# Patient Record
Sex: Female | Born: 1960
Health system: Southern US, Community
[De-identification: ages and names within clinical notes are randomized; demographics above are authoritative.]

## PROBLEM LIST (undated history)

## (undated) DIAGNOSIS — K219 Gastro-esophageal reflux disease without esophagitis: Secondary | ICD-10-CM

## (undated) DIAGNOSIS — E785 Hyperlipidemia, unspecified: Secondary | ICD-10-CM

## (undated) DIAGNOSIS — D151 Benign neoplasm of heart: Secondary | ICD-10-CM

## (undated) DIAGNOSIS — I272 Pulmonary hypertension, unspecified: Secondary | ICD-10-CM

## (undated) HISTORY — PX: ECTOPIC PREGNANCY SURGERY: SHX613

## (undated) HISTORY — PX: TUBAL LIGATION: SHX77

## (undated) HISTORY — DX: Pulmonary hypertension, unspecified: I27.20

## (undated) HISTORY — PX: APPENDECTOMY: SHX54

## (undated) HISTORY — DX: Hyperlipidemia, unspecified: E78.5

---

## 1998-05-08 ENCOUNTER — Other Ambulatory Visit: Admission: RE | Admit: 1998-05-08 | Discharge: 1998-05-08 | Payer: Self-pay | Admitting: Obstetrics and Gynecology

## 1998-09-25 ENCOUNTER — Inpatient Hospital Stay (HOSPITAL_COMMUNITY): Admission: EM | Admit: 1998-09-25 | Discharge: 1998-09-28 | Payer: Self-pay | Admitting: Emergency Medicine

## 1999-05-13 ENCOUNTER — Encounter: Payer: Self-pay | Admitting: Family Medicine

## 1999-05-13 ENCOUNTER — Encounter: Admission: RE | Admit: 1999-05-13 | Discharge: 1999-05-13 | Payer: Self-pay | Admitting: Family Medicine

## 1999-12-02 ENCOUNTER — Inpatient Hospital Stay (HOSPITAL_COMMUNITY): Admission: EM | Admit: 1999-12-02 | Discharge: 1999-12-04 | Payer: Self-pay

## 1999-12-02 ENCOUNTER — Encounter (INDEPENDENT_AMBULATORY_CARE_PROVIDER_SITE_OTHER): Payer: Self-pay | Admitting: Specialist

## 2000-06-11 ENCOUNTER — Encounter: Payer: Self-pay | Admitting: Emergency Medicine

## 2000-06-11 ENCOUNTER — Emergency Department (HOSPITAL_COMMUNITY): Admission: EM | Admit: 2000-06-11 | Discharge: 2000-06-11 | Payer: Self-pay | Admitting: Emergency Medicine

## 2000-07-08 ENCOUNTER — Ambulatory Visit (HOSPITAL_COMMUNITY): Admission: RE | Admit: 2000-07-08 | Discharge: 2000-07-08 | Payer: Self-pay | Admitting: Family Medicine

## 2002-01-16 ENCOUNTER — Other Ambulatory Visit: Admission: RE | Admit: 2002-01-16 | Discharge: 2002-01-16 | Payer: Self-pay | Admitting: *Deleted

## 2002-11-23 ENCOUNTER — Encounter: Payer: Self-pay | Admitting: Emergency Medicine

## 2002-11-23 ENCOUNTER — Emergency Department (HOSPITAL_COMMUNITY): Admission: EM | Admit: 2002-11-23 | Discharge: 2002-11-23 | Payer: Self-pay | Admitting: Emergency Medicine

## 2002-11-30 ENCOUNTER — Encounter: Payer: Self-pay | Admitting: Family Medicine

## 2002-11-30 ENCOUNTER — Encounter: Admission: RE | Admit: 2002-11-30 | Discharge: 2002-11-30 | Payer: Self-pay | Admitting: Family Medicine

## 2005-04-02 ENCOUNTER — Other Ambulatory Visit: Admission: RE | Admit: 2005-04-02 | Discharge: 2005-04-02 | Payer: Self-pay | Admitting: Obstetrics and Gynecology

## 2006-07-15 ENCOUNTER — Other Ambulatory Visit: Admission: RE | Admit: 2006-07-15 | Discharge: 2006-07-15 | Payer: Self-pay | Admitting: Obstetrics & Gynecology

## 2006-08-03 ENCOUNTER — Encounter: Admission: RE | Admit: 2006-08-03 | Discharge: 2006-08-03 | Payer: Self-pay | Admitting: *Deleted

## 2008-01-26 ENCOUNTER — Ambulatory Visit (HOSPITAL_COMMUNITY): Admission: RE | Admit: 2008-01-26 | Discharge: 2008-01-26 | Payer: Self-pay | Admitting: Family Medicine

## 2008-09-03 ENCOUNTER — Emergency Department (HOSPITAL_COMMUNITY): Admission: EM | Admit: 2008-09-03 | Discharge: 2008-09-03 | Payer: Self-pay | Admitting: Emergency Medicine

## 2010-03-02 ENCOUNTER — Encounter: Admission: RE | Admit: 2010-03-02 | Discharge: 2010-03-02 | Payer: Self-pay | Admitting: Internal Medicine

## 2010-07-12 ENCOUNTER — Encounter: Payer: Self-pay | Admitting: Internal Medicine

## 2010-07-12 ENCOUNTER — Encounter: Payer: Self-pay | Admitting: Obstetrics & Gynecology

## 2010-10-01 LAB — WET PREP, GENITAL

## 2010-10-01 LAB — GC/CHLAMYDIA PROBE AMP, GENITAL: GC Probe Amp, Genital: NEGATIVE

## 2010-11-06 NOTE — Op Note (Signed)
[facility]  Patient:    Ariana Ramirez, Ariana Ramirez                      MRN: [id number] Proc. Date: 12/02/99 Adm. Date:  [id number] Disc. Date: [id number] Attending:  Fortino Sic                           Operative Report  PREOPERATIVE DIAGNOSIS:  Acute appendicitis.  POSTOPERATIVE DIAGNOSIS:  Acute appendicitis.  OPERATION PERFORMED:  Appendectomy.  SURGEON:  Marnee Spring. Wiliam Ke, M.D.  ASSISTANT:  P.A. Palmer.  ANESTHESIA:  Endotracheal by hospital.  DESCRIPTION OF PROCEDURE:  Under good endotracheal anesthesia, the skin of the abdomen was prepped and draped in the usual manner. A transverse incision was made at McBurneys point in deep subcutaneous tissue. The external oblique, internal oblique, and transversalis muscles were divided in the direction of their fibers. The peritoneum was opened, the cecum was identified. The appendix was delivered. The patient clearly had acute appendicitis. The appendiceal mesentery was doubly clamped, cut, and tied off with 2-0 silk. The appendix was tied off with an #0 chromic tie and a 2-0 silk tie. The appendix was excised and the stump was cauterized with electrocautery current. Hemostasis was excellent. The right lower quadrant was irrigated with saline and sucked dry. The peritoneum was closed with running #0 chromic catgut. Several #0 chromic catgut sutures were placed in the internal transversalis layer. The external oblique aponeurosis was closed with running #0 chromic catgut. Each layer was irrigated with saline prior to closure. Several 3-0 Vicryl were placed in Scarpas fascia. The skin was closed with subcuticular 4-0 Dexon and Steri-Strips applied. Estimated blood loss minimal. The patient received no blood in the lab, left the operating room in stable condition after sponge and needle counts were verified. DD:  12/02/99 TD:  12/05/99 Job: [id number]

## 2010-11-06 NOTE — H&P (Signed)
[facility]  Patient:    Ariana Ramirez, Ariana Ramirez                      MRN: [id number] Proc. Date: 12/02/99 Adm. Date:  [id number] Attending:  Fortino Sic                         History and Physical  CHIEF COMPLAINT:  Abdominal pain.  HISTORY OF PRESENT ILLNESS:  This is a 50 year old female, status post left oophorectomy, tubal ligation, ectopic pregnancy, and C-section having normal periods and a negative pregnancy test.  She has a [place] history of right lower quadrant pain.  It was first crampy, now constant.  This is associated with anorexia but nausea or vomiting.  No fevers, chills, or sweating spells. No cough, chest pain, or pleurisy.  No dysuria, frequency, hematuria, or stone.  PAST MEDICAL HISTORY:  Essentially negative.  PAST SURGICAL HISTORY:  As above.  CIGARETTES:  None.  ALCOHOL:  None.  MEDICATIONS:  None.  ALLERGIES:  None.  REVIEW OF SYSTEMS:  Essentially negative.  FAMILY HISTORY:  Noncontributory.  PHYSICAL EXAMINATION:  VITAL SIGNS:  Temperature 99, pulse 88, respirations 16.  GENERAL APPEARANCE:  Well-developed, well-nourished in mild abdominal distress.  HEENT:  Cranium normocephalic.  Eyes, ears, nose, throat normal.  NECK:  Supple.  CHEST:  Clear.  HEART:  Regular rhythm without murmurs, or gallops.  ABDOMEN:  Obese with tenderness in the right lower quadrant and some referred tenderness.  PELVIC AND RECTAL EXAM:  Negative.  EXTREMITIES:  Neurological.  SKIN:  Within normal limits.  ADMITTING IMPRESSION:  Acute appendicitis. DD:  12/02/99 TD:  12/02/99 Job: [id number]

## 2010-11-06 NOTE — Discharge Summary (Signed)
[facility]  Patient:    Ariana Ramirez, Ariana Ramirez                      MRN: [id number] Adm. Date:  [id number] Disc. Date: [id number] Attending:  Fortino Sic                           Discharge Summary  ADMITTING DIAGNOSES:  Acute appendicitis.  DISCHARGE DIAGNOSES:  Acute appendicitis.  OPERATION PERFORMED:  December 02, 1999 appendectomy.  ADMITTING DATA:  This 50 year old female with a 24 hour history of right-sided abdominal pain. Admitting laboratory work reveals a slightly elevated white count with a shift to the left. CT scan performed via the emergency room revealed early acute appendicitis.  HOSPITAL COURSE:  The patient was admitted and prepared for surgery. The surgery was performed with her postoperative course benign. At the time of discharge, she is up and about tolerating a diet.  CONDITION ON DISCHARGE:  Good.  DISCHARGE DIET:  As tolerated.  DISCHARGE ACTIVITY:  No lifting or calisthenics.  DISCHARGE MEDICATIONS:  Vicodin.  FOLLOW-UP:  She is to see Dr. Wiliam Ke 6 or 7 days of discharge. DD:  12/04/99 TD:  12/08/99 Job: [id number]

## 2011-07-21 ENCOUNTER — Encounter: Payer: 59 | Attending: Internal Medicine | Admitting: *Deleted

## 2011-07-21 ENCOUNTER — Encounter: Payer: Self-pay | Admitting: *Deleted

## 2011-07-21 NOTE — Progress Notes (Signed)
  Patient was seen on 07/21/2011 for the first of a series of three diabetes self-management courses at [facility]. The following learning objectives were met by the patient during this course:   Defines the role of glucose and insulin  Identifies type of diabetes and pathophysiology  Defines the diagnostic criteria for diabetes and prediabetes  States the risk factors for Type 2 Diabetes  States the symptoms of Type 2 Diabetes  Defines Type 2 Diabetes treatment goals  Defines Type 2 Diabetes treatment options  States the rationale for glucose monitoring  Identifies A1C, glucose targets, and testing times  Identifies proper sharps disposal  Defines the purpose of a diabetes food plan  Identifies carbohydrate food groups  Defines effects of carbohydrate foods on glucose levels  Identifies carbohydrate choices/grams/food labels  States benefits of physical activity and effect on glucose  Review of suggested activity guidelines  Last A1c: 7.1% (06/23/11 per NH @ [facility])  Handouts given during class include:  Type 2 Diabetes: Basics Book  My Food Plan Book  Food and Activity Log  Patient has established the following initial goals:  Monitor blood glucose levels.  Take medications appropriately.  Lose weight.  Follow-Up Plan: Pt to meet 1-on-1 with Jenita Seashore (RD @ [facility]) for Core Classes II and III d/t scheduling conflicts with classes.

## 2011-07-21 NOTE — Patient Instructions (Signed)
Patient will attend Core Diabetes education visits with Bev Paddock as scheduled or follow up prn.

## 2011-07-29 ENCOUNTER — Encounter: Payer: 59 | Attending: Internal Medicine | Admitting: *Deleted

## 2011-07-29 ENCOUNTER — Encounter: Payer: Self-pay | Admitting: *Deleted

## 2011-07-29 DIAGNOSIS — E119 Type 2 diabetes mellitus without complications: Secondary | ICD-10-CM | POA: Insufficient documentation

## 2011-07-29 DIAGNOSIS — Z713 Dietary counseling and surveillance: Secondary | ICD-10-CM | POA: Insufficient documentation

## 2011-07-29 NOTE — Progress Notes (Signed)
  Patient was seen on 07/29/2011 for the second of a series of three diabetes self-management courses at the [facility]. The following learning objectives were met by the patient during this course:   Explain basic nutrition maintenance and quality assurance  Describe causes, symptoms and treatment of hypoglycemia and hyperglycemia  Explain how to manage diabetes during illness  Describe the importance of good nutrition for health and healthy eating strategies  List strategies to follow meal plan when dining out  Describe problem solving skills for day-to-day glucose challenges  Describe strategies to use when treatment plan needs to change  Identify important factors involved in successful weight loss  Describe ways to remain physically active  Describe the impact of regular activity on insulin resistance  Patient updated their initials goals from Core Class I to include:  Identify Carbohydrate containing foods on plate at each meal  Aim for 30-45 grams carbohydrate at each meal  Handouts given in class:  Refrigerator magnet for Sick Day Guidelines  [facility] Oral medication/insulin handout  Follow-Up Plan: Patient will attend the final class of the ADA Diabetes Self-Care Education.

## 2011-07-30 ENCOUNTER — Ambulatory Visit: Payer: Self-pay | Admitting: *Deleted

## 2011-07-30 ENCOUNTER — Encounter: Payer: 59 | Admitting: *Deleted

## 2011-07-30 NOTE — Progress Notes (Signed)
  Patient was seen on 07/30/2011 individually for the third of a series of three diabetes self-management courses at the [facility]. The following learning objectives were met by the patient during this course:    Describe how diabetes changes over time   Identify diabetes complications and ways to prevent them   Describe strategies that can promote heart health including lowering blood pressure and cholesterol   Describe strategies to lower dietary fat and sodium in the diet   Identify physical activities that benefit cardiovascular health   Evaluate success in meeting personal goal   Describe the belief that they can live successfully with diabetes day to day   Establish 2-3 goals that they will plan to diligently work on until they return for the free 24-month follow-up visit  The following handouts were emailed to patient:  3 Month Follow Up Visit handout  Goal setting handout  Class evaluation form  Your patient has established the following 3 month goals for diabetes self-care:  Identify carbohydrate containing foods at each meal  Aim for 30-45 grams carbohydrate at each meal, 0-15 grams at snacks if hungry  Record BG in log book periodically to evaluate BG management goals  Follow-Up Plan: Patient will attend a 3 month follow-up visit for diabetes self-management education on Oct 25, 2011.

## 2011-10-25 ENCOUNTER — Ambulatory Visit: Payer: 59 | Admitting: *Deleted

## 2011-11-10 ENCOUNTER — Ambulatory Visit: Payer: 59 | Admitting: *Deleted

## 2012-01-10 ENCOUNTER — Encounter: Payer: 59 | Attending: Internal Medicine | Admitting: *Deleted

## 2012-01-10 DIAGNOSIS — Z713 Dietary counseling and surveillance: Secondary | ICD-10-CM | POA: Insufficient documentation

## 2012-01-10 DIAGNOSIS — E119 Type 2 diabetes mellitus without complications: Secondary | ICD-10-CM | POA: Insufficient documentation

## 2012-01-10 NOTE — Progress Notes (Signed)
  Patient was seen on 01/10/2012 for their 3 month follow-up as a part of the diabetes self-management courses at the [facility]. The following learning objectives were met by your patient during this course:  Patient self reports the following:  Diabetes control has improved since diabetes self-management training: YES Number of days blood glucose is >200:  NONE Last MD appointment for diabetes: Scheduled for next week Changes in treatment plan: More aware of carb choices and limits them to 2 servings per meal Confidence with ability to manage diabetes: IMPROVED Areas for improvement with diabetes self-care: Doing well Willingness to participate in diabetes support group: NO  Please see Diabetes Flow sheet for findings related to patient's self-care.  Follow-Up Plan: Patient is eligible for a "free" 30 minute diabetes self-care appointment in the next year. Patient to call and schedule as needed.

## 2013-04-12 ENCOUNTER — Ambulatory Visit (INDEPENDENT_AMBULATORY_CARE_PROVIDER_SITE_OTHER): Payer: Self-pay | Admitting: Family Medicine

## 2013-04-12 DIAGNOSIS — E119 Type 2 diabetes mellitus without complications: Secondary | ICD-10-CM

## 2013-04-12 NOTE — Progress Notes (Signed)
Subjective/Objective:  Patient presents to the [facility] for her 3 month DM follow-up appointment through the employer-sponsored Link to Wellness program. She was originally a patient of Gilman Buttner, RN but has since been transferred to my care after Nancy's departure from her previous position as a CCM at [employer]. 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 Patient reports that she is feeling well otherwise. Patient had been diagnosed with pre-diabetes in 2012 and continues to struggle to accept that diagnosis.  A1c (POC test at today's visit) 7.0%; BP 132/77; P 77; Wt 187 lbs  Assessment:  Patient was diagnosed with pre-diabetes in 2012. She is being managed by Dr. Guerry Bruin at [facility] approximately two times per year. Patient is doing fairly well with managing diabetes with diet and exercise. Current A1c today via POC was 7.0%. She takes metformin once daily for diabetes medication management. Her biggest weakness as far as diet is rice. It is hard for her to resist rice since rice is a staple in her culture and family's diet. She has decreased the amount of rice she eats to just 1-2 servings per day. Otherwise, she is doing well with her diet. Patient reports that she doesn't have much time and/or energy to exercise with working long days. On her days off, she does try to walk some for exercise or do housework. Patient checks blood glucose once weekly in the morning fasting. She reports readings in the 120s. Did not bring glucometer to appointment today. Denies any hypoglycemic episodes, but patient has hypoglycemia awareness. Patient is due for an annual eye exam in December. Sees dentist two times per year. Checks feet daily. Is not currently taking an ACE-inhibitor.  Plan:  Patient's personal goals are as follows: 1.  Lose 5 pounds with diet improvements and exercise 2 days/week. 2. Make yearly eye exam appointment.  I will ask Dr. Wylene Simmer about considering an ACE-inhibitor for this patient.

## 2013-10-08 ENCOUNTER — Ambulatory Visit (INDEPENDENT_AMBULATORY_CARE_PROVIDER_SITE_OTHER): Payer: Self-pay | Admitting: Family Medicine

## 2013-10-08 DIAGNOSIS — E119 Type 2 diabetes mellitus without complications: Secondary | ICD-10-CM

## 2013-10-08 NOTE — Progress Notes (Signed)
Subjective/Objective:  Patient presents to the [place] for her 3 month DM follow-up appointment through the employer-sponsored Link to Wellness program. Patient reports that she is feeling better now that her work schedule has normalized. Previously, she was having to switch between third and first shifts from week to week. Admits that it was overwhelming at work and states that it was hard for her body to adjust to the shift changes. Patient is doing fairly well at managing diabetes. She is currently taking metformin for diabetes medication management.  A1c: 6.8% (2/15); BP 128/77 mmHg; P 78 bpm; Ht 5'7"; Wt 189 lbs; BMI 29.6; Pain 0/10;   Patient tests blood glucose two times weekly. Self-reported CBG (fasting): less than 120   Assessment:  Patient is doing fairly well with managing diabetes with diet and exercise. Patient reports A1c of 6.8% at appointment with Dr. Osborne Casco in February. Previous A1c was 7.0% via POC here at the Outpatient Pharmacy on 04/05/13. She currently takes metformin for diabetes medication management. Her biggest weakness as far as diet is bread, but patient has been restricting the amount of bread she eats. Otherwise, she is doing well with her diet. Patient is exercising a few days a week with a partner at the new gym at [facility]. Patient checks blood glucose twice a week in the morning fasting (does occasionally check in the evenings postprandial). She reports readings in the 120s. Denies any hypoglycemic episodes. Patient is due for an annual eye exam in December. Sees dentist two times per year. Checks feet daily. Is not currently taking an ACE-inhibitor. Patient will see Dr. Osborne Casco in August for follow-up.   Plan:  Patient's personal goals before next follow-up visit with me are as follows:  1. Make more time for exercise (2 days a week).  2. Goal A1c 6.5%. 3. Follow up with Cheryll Dessert, PharmD at the [place] on Thursday, August   27th at 8:30am.

## 2014-02-21 ENCOUNTER — Ambulatory Visit (INDEPENDENT_AMBULATORY_CARE_PROVIDER_SITE_OTHER): Payer: Self-pay | Admitting: Family Medicine

## 2014-02-21 VITALS — BP 128/74 | HR 71 | Wt 194.0 lb

## 2014-02-21 DIAGNOSIS — E119 Type 2 diabetes mellitus without complications: Secondary | ICD-10-CM | POA: Insufficient documentation

## 2014-02-21 NOTE — Progress Notes (Signed)
Patient presents today for 3 month diabetes follow-up as part of the employer-sponsored Link to Wellness program. Patient was formerly seeing Caryl Pina, [facility] and will be transitioning to myself for future LTW visits. Current diabetes regimen includes Metformin. Patient is not on daily ASA or ACEi, but is on daily statin. Most recent MD follow-up was Feb 2015. Patient has a pending appt for next week on 02/27/14. No med changes or major health changes at this time.  Diabetes Assessment: Type of Diabetes: Type 2; Year of diagnosis 2012; Diabetes Education [facility]; MD managing Diabetes [facility]; Sees Diabetes provider 2 times per year; checks feet daily; uses glucometer; hypoglycemia frequency 0; 14 day CBG average CBG readings not available. Did not bring meter to appointment.; Did not bring meter to appointment.; checks blood glucose 1-2 times a week; takes medications as prescribed; does not take an aspirin a day; most recent A1c 6.29 Jul 2013 Other Diabetes History: Current med regimen includes Metformin 500 mg daily. Patient does maintain good medication compliance, and misses doses only on rare occassion. Patient did not bring meter today but is currently testing 1-2 times per week. 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 Per pt report glucose runs 100-130s depending on what she has eaten that particular day. Hypoglycemia frequency is rare. Patient does demonstrate appropriate correction of hypoglycemia. Patient denies signs and symptoms of neuropathy including numbness/tingling/burning and symptoms of foot infection. Patient is not due for yearly eye exam, had most recent exam in Dec 2014. Will be due soon.  Lifestyle Factors: Diet - Patient admits that diet could use improvement. This is mainly due to patients variable work schedule. She works night shift 7a-7p, and works one week on, one week off. Therefore, her daily schedule is  constantly changing. Food recall is as follows: Breakfast - cereal (raisin bran or wheaties), sometimes oatmeal if she has time Lunch - eats at home or cafeteria when at work, usually a rice dish or soup from [employer] - various things, often includes rice Pt feels portion sizes are under good control. Rarely eats sweets. Patient eats white rice often, as this is a cultural dish. She often prepares it with vegetables or meat.  Beverages - one coffee per day at work, hot tea, water, soda only on occassion Exercises - No routine exercise lately. In the past patient has used gym at [facility]. However, her husband was recently diagnosed with bladder cancer and has been undergoing treatment. Their schedule is starting to return to normal and patient is hopeful she will be able to resume exercise soon. She plans to utilize the fitness facility at [facility] and also plans to walk with a friend at [place] shopping center once per week.   Assessment: Patient is doing well today with no change to DM care. She will follow-up with MD soon and have repeat labwork at that time. Patient attempts to maintain healthy diet, but has struggled to find time for routine exercise given recent illness of her husband. She will work to get back on track with regular exercise soon. Patient will return in 3 months for LTW follow-up.  Plan: 1) Continue to make healthy dietary choices 2) Attempt to resume regular exercise once able 3) Continue to be compliant with medications 4) Keep appt with MD on 02/27/14 5) Follow-up with LTW in 3 months on Wednesday Dec 2nd @ 8:30 am

## 2014-02-27 ENCOUNTER — Encounter: Payer: Self-pay | Admitting: Family Medicine

## 2014-02-27 NOTE — Progress Notes (Signed)
Patient ID: Ariana Ramirez, female   DOB: [birth year], 53 y.o.   MRN: [id number] Reviewed: Agree with the documentation and management of our [facility] pharmacologist.

## 2014-05-08 ENCOUNTER — Encounter: Payer: Self-pay | Admitting: Family Medicine

## 2014-05-08 NOTE — Progress Notes (Signed)
Patient ID: Ariana Ramirez, female   DOB: 02/01/1961, 53 y.o.   MRN: [id number] Reviewed: Agree with the documentation and management of our [place] Pharmacologist. 

## 2014-05-08 NOTE — Progress Notes (Signed)
Patient ID: Ariana Ramirez, female   DOB: 08/20/1960, 53 y.o.   MRN: [id number] Reviewed: Agree with the documentation and management of our [place] Pharmacologist. 

## 2014-05-22 ENCOUNTER — Ambulatory Visit (INDEPENDENT_AMBULATORY_CARE_PROVIDER_SITE_OTHER): Payer: Self-pay | Admitting: Family Medicine

## 2014-05-22 VITALS — BP 124/74 | Wt 193.0 lb

## 2014-05-22 DIAGNOSIS — E119 Type 2 diabetes mellitus without complications: Secondary | ICD-10-CM

## 2014-05-22 NOTE — Progress Notes (Signed)
Patient presents today for 3 month diabetes follow-up as part of the employer-sponsored Link to Wellness program. Current diabetes regimen includes Metformin. Patient is not on daily ASA or ACEi, but is on daily statin. Most recent MD follow-up was Sept 2015. Patient has a pending appt for 6 month follow-up in March 2016. No med changes or major health changes at this time.  Diabetes Assessment: Type of Diabetes: Type 2; Year of diagnosis 2012; Diabetes Education [facility]; MD managing Diabetes [place]; Sees Diabetes provider 2 times per year; checks feet daily; uses glucometer; does not take an aspirin a day; hypoglycemia frequency none; Lowest CBG 91; Highest CBG 156; A1c Sept 2015 6.7 Other Diabetes History: Current med regimen includes Metformin 500 mg daily. Patient does maintain good medication compliance, and misses doses only on rare occassion. Patient did not bring meter today but is currently testing once daily, 1-2 times per week. 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 Per pt report glucose runs 120-150s depending on what she has eaten that particular day. Hypoglycemia frequency is rare. Patient does demonstrate appropriate correction of hypoglycemia. Patient denies signs and symptoms of neuropathy including numbness/tingling/burning and symptoms of foot infection. Patient is due for yearly eye exam, and has apt scheduled for late December.   Lifestyle Factors: Diet - No changes to diet recently. Patient feels she needs to improve diet, specifically the time at which she eats. 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 Patient feels food choices and portion sizes are under good control. Food recall as of Sept visit is as follows: Breakfast - cereal (raisin bran or wheaties), sometimes oatmeal if she has time Lunch - eats at home or cafeteria when at work, usually a rice dish or soup from [employer] -  various things, often includes rice Pt feels portion sizes are under good control. Rarely eats sweets. Patient eats white rice often, as this is a cultural dish. She often prepares it with vegetables or meat.  Beverages - one coffee per day at work, hot tea, water, soda only on occassion Exercises - No routine exercise but attempts to stay active around the house. Does not like to walk in the colder weather. Was using [facility] gym on occasion, but has not used it lately. Patient would like to begin exercising more and would like to set a weight loss goal of 5 lbs over the next three months.  Assessment: Patient is doing well today with no change to DM care. She will follow-up with MD in March for 6 month visit. Patient attempts to maintain healthy diet, but has struggled to find time for routine exercise given recent illness of her husband. She will work to get back on track with regular exercise soon. Patient will return in 3 months for LTW follow-up.  Plan: 1) Continue to make healthy dietary choices 2) Attempt to resume regular exercise, weight loss goal of 5 lb 3) Continue to be compliant with medications 4) Keep appt with MD in March 5) Follow-up with LTW in 3 months on Wed March 16th @ 8:30

## 2014-07-04 ENCOUNTER — Encounter: Payer: Self-pay | Admitting: Family Medicine

## 2014-07-04 NOTE — Progress Notes (Signed)
Patient ID: Ariana Ramirez, female   DOB: 08/07/1960, 54 y.o.   MRN: [id number] Reviewed: Agree with the documentation and management of our [place].

## 2014-09-10 ENCOUNTER — Ambulatory Visit: Payer: 59 | Admitting: Pharmacist

## 2014-09-10 ENCOUNTER — Ambulatory Visit (INDEPENDENT_AMBULATORY_CARE_PROVIDER_SITE_OTHER): Payer: Self-pay | Admitting: Family Medicine

## 2014-09-10 VITALS — BP 142/78 | Ht 67.0 in | Wt 199.0 lb

## 2014-09-10 DIAGNOSIS — E119 Type 2 diabetes mellitus without complications: Secondary | ICD-10-CM

## 2014-09-10 NOTE — Progress Notes (Signed)
Subjective:  Patient presents today for 3 month diabetes follow-up as part of the employer-sponsored [facility] program.  Current diabetes regimen includes Metformin.  Patient also continues on daily statin.  She does not tolerate ASA and has not been prescribed ACEi at this time.  Patient is being treated for HTN with Triamterene/HCTZ.  Patient follows-up routinely with Dr. Osborne Casco, PCP for diabetes review and as needed for other medical issues.  No med changes or major health changes at this time.   Assessment/Plan:  Patient is a 54 yo female with DM Type 2. Most recent A1C was 6.7% which is slightly above goal of 6.5% as stated by MD.  Weight has increased by 5 lbs since last visit and is now 199 lb. Patient reports this is stress related.  Patient continues to maintain medication compliance and continues testing once daily, fasting.  Patient is recovering from recent upper respiratory infection (common cold) and reports this has caused slight elevations in glucose.  Patient did not bring meter today but reports fasting is generally 120-130 but has recently been >130.  Also, patient is attempting to enroll/qualify for study sponsored by [employer] [employer] to assess diabetic testing and education.  She is hopeful she will be able to enroll.     Physical Activity- Patient is currently using [place] fitness facility once weekly for 60 min, using treadmill.  She is aware of the need to improve in this area and would like to add an additional day of exercise.  We have discussed the benefits of this and have set an appropriate goal.     Nutrition-Patient attempts to maintain a healthy diet.  After food recall, it appears she is making healthy choices and is attmepting to eat at least three servings of fruit and/or veg per day.  However, she does consume a fair amount of rice with her meals.  She is unsure of her portions of this starchy carb.  We have discussed reading food labels and using a measuring device to  accurately measure rice portions.    Patient will follow up with [facility] in 3 months.    Tilman Neat, PharmD

## 2014-09-10 NOTE — Patient Instructions (Addendum)
1)  Patient will exercise once weekly at home in addition to exercising at [facility] fitness facility 2)  Attempt to improve portion control by measuring starchy carbs using a measuring cup and by reading nutrition labels 3)  Follow-up in 3 months with [facility]

## 2014-09-11 NOTE — Progress Notes (Signed)
ATTENDING PHYSICIAN NOTE: I have reviewed the chart and agree with the plan as detailed above. Shamara Soza MD Pager [phone]  

## 2014-11-26 ENCOUNTER — Encounter: Payer: Self-pay | Admitting: Pharmacist

## 2014-12-07 ENCOUNTER — Emergency Department (HOSPITAL_COMMUNITY)
Admission: EM | Admit: 2014-12-07 | Discharge: 2014-12-07 | Disposition: A | Discharge status: Home / Self Care | Payer: 59 | Source: Home / Self Care | Attending: Emergency Medicine | Admitting: Emergency Medicine

## 2014-12-07 ENCOUNTER — Encounter (HOSPITAL_COMMUNITY): Payer: Self-pay | Admitting: Emergency Medicine

## 2014-12-07 DIAGNOSIS — K2970 Gastritis, unspecified, without bleeding: Secondary | ICD-10-CM | POA: Diagnosis not present

## 2014-12-07 MED ORDER — RANITIDINE HCL 150 MG PO CAPS: 150.0000 mg | ORAL_CAPSULE | Freq: Two times a day (BID) | ORAL | Status: DC

## 2014-12-07 MED ORDER — SUCRALFATE 1 GM/10ML PO SUSP: 1.0000 g | Freq: Three times a day (TID) | ORAL | Status: DC

## 2014-12-07 MED ORDER — GI COCKTAIL ~~LOC~~: 30.0000 mL | Freq: Once | ORAL | Status: DC

## 2014-12-07 MED ORDER — GI COCKTAIL ~~LOC~~
ORAL | Status: AC
  Filled 2014-12-07: qty 30

## 2014-12-07 NOTE — Discharge Instructions (Signed)
The lining of your stomach has become irritated, this is likely from the spicy food in [place]. Continue your protonix daily. Take ranitidine twice a day for the next 2 weeks. Take Carafate before meals and at bedtime for the next 2 weeks. Avoid citrus, tomato, spicy foods for the next 2 weeks. If you start vomiting, you develope severe abdominal pain, you see dark tarry stools, or you see blood in your stool, please go to the emergency room.

## 2014-12-07 NOTE — ED Provider Notes (Signed)
CSN: [id number]     Arrival date & time 12/07/14  1854 History   First MD Initiated Contact with Patient 12/07/14 1913     Chief Complaint  Patient presents with  . Abdominal Pain   (Consider location/radiation/quality/duration/timing/severity/associated sxs/prior Treatment) HPI She is a 54 year old woman here for evaluation of epigastric pain. She states this pain started on Monday. It is located in the epigastrium, she describes it as a constant gnawing pain. She thinks it is getting slightly worse. She reports a decreased appetite and some intermittent nausea, but no vomiting. The pain is not better with eating, and may be slightly worse. She was in [place] and just returned on Tuesday. She did have some diarrhea while in [place], but this cleared up after taking some Flagyl. She had a normal bowel movement this morning. She denies any blood in her stool. She states she did eat some very spicy food in [place].  She is on Protonix for reflux.  Past Medical History  Diagnosis Date  . Diabetes mellitus   . Pulmonary hypertension   . Hyperlipidemia    Past Surgical History  Procedure Laterality Date  . Cesarean section    . Appendectomy    . Ectopic pregnancy surgery    . Tubal ligation     Family History  Problem Relation Age of Onset  . Hypertension Other   . Stroke Father   . Hypertension Paternal Grandmother    History  Substance Use Topics  . Smoking status: Never Smoker   . Smokeless tobacco: Not on file  . Alcohol Use: No   OB History    No data available     Review of Systems As in history of present illness Allergies  Aspirin and Food  Home Medications   Prior to Admission medications   Medication Sig Start Date End Date Taking? Authorizing Provider  metFORMIN (GLUCOPHAGE) 500 MG tablet Take 500 mg by mouth daily.    Yes Historical Provider, MD  pantoprazole (PROTONIX) 40 MG tablet Take 40 mg by mouth 2 (two) times daily.   Yes Historical Provider, MD   simvastatin (ZOCOR) 40 MG tablet Take 40 mg by mouth every evening.   Yes Historical Provider, MD  triamterene-hydrochlorothiazide (MAXZIDE-25) 37.5-25 MG per tablet Take 1 tablet by mouth daily.   Yes Historical Provider, MD  ranitidine (ZANTAC) 150 MG capsule Take 1 capsule (150 mg total) by mouth 2 (two) times daily. For 2 weeks. 12/07/14   Melony Overly, MD  sucralfate (CARAFATE) 1 GM/10ML suspension Take 10 mLs (1 g total) by mouth 4 (four) times daily -  with meals and at bedtime. For 2 weeks. 12/07/14   Melony Overly, MD   BP 148/78 mmHg  Pulse 84  Temp(Src) 98.7 F (37.1 C) (Oral)  Resp 16  SpO2 100% Physical Exam  Constitutional: She is oriented to person, place, and time. She appears well-developed and well-nourished. No distress.  Neck: Neck supple.  Cardiovascular: Normal rate, regular rhythm and normal heart sounds.   No murmur heard. Pulmonary/Chest: Effort normal.  Abdominal: Soft. Bowel sounds are normal. She exhibits no distension. There is tenderness (in epigastric). There is no rebound and no guarding.  Neurological: She is alert and oriented to person, place, and time.    ED Course  Procedures (including critical care time) Labs Review Labs Reviewed - No data to display  Imaging Review No results found.   MDM   1. Gastritis    GI cocktail given.  She  reports improvement after GI cocktail. Will add ranitidine and Carafate to her Protonix for the next 2 weeks. Return precautions reviewed.    Melony Overly, MD 12/07/14 [phone]

## 2014-12-07 NOTE — ED Notes (Signed)
Pt reports epigastric discomfort onset 1 week; denies pain Sx also include decreased appetite, chills, BA Denies fevers Hx of GERD; taking Protonix Reports she just came from [place]  Alert, no signs of acute distress.

## 2014-12-10 ENCOUNTER — Ambulatory Visit: Payer: 59 | Admitting: Pharmacist

## 2014-12-10 ENCOUNTER — Ambulatory Visit: Payer: 59

## 2015-01-09 ENCOUNTER — Ambulatory Visit: Payer: 59 | Admitting: Pharmacist

## 2015-01-09 ENCOUNTER — Ambulatory Visit (INDEPENDENT_AMBULATORY_CARE_PROVIDER_SITE_OTHER): Payer: Self-pay | Admitting: Family Medicine

## 2015-01-09 VITALS — BP 122/66 | Ht 67.0 in | Wt 182.0 lb

## 2015-01-09 DIAGNOSIS — E119 Type 2 diabetes mellitus without complications: Secondary | ICD-10-CM

## 2015-01-09 NOTE — Patient Instructions (Addendum)
1)  Attempt to resume exercise twice weekly once GI issues resolve 2)  Continue making healthy dietary choices, limiting portions of starchy carbs 3)  Continue testing regularly 4)  Schedule a routine dental exam 5)  Follow-up in 3 months on Thursday October 27th at 8:30 am  Great to see you today!

## 2015-01-09 NOTE — Progress Notes (Signed)
  Subjective:  Patient is a 54 yo female with type 2 diabetes who presents today for 3 month follow-up as part of the employer-sponsored [facility] program. Current diabetes regimen includes Metformin. Patient also continues on daily statin.  Intolerant to ASA and not currently taking ACEi.  Most recent MD follow-up was this month with Dr. Osborne Casco. Patient has a pending appt for Sept 2016.  Of note, patient has been under much stress over the past 3-4 weeks.  Her mother passed unexpectedly in late June.  She then had to make an emergency trip to [place] to be with her family.  While in [place] she began developing GI symptoms and was treated for infection.  Once returning to the [place], symptoms continued, she is being treated with carafate tablets and reports this has helped some.  She is being further evaluated by gastro and is scheduled for an endoscopy to look for ulcer or inflammation.  GI symptoms have changed her diet and she is currently adhering to a very bland diet, no dairy, and no heavy, fried, or greasy foods as these increase symptoms.  Diabetes Assessment:  Changes to diabetes regimen include increasing Metformin from once daily to twice daily.  This was done at recent MD appt due to elevated A1c.  A1c likely elevated due to GI illness and stress of losing a family member.  Patient does maintain good medication compliance. Most recent A1c was 7.3% which is exceeding goal of less than 7%.  Weight has decreased by 16 lbs since last visit, due to GI upset.  Patient did bring meter today and is currently testing 1-2 times per day.  She is currently using [employer] meter through the recent study.  She will continue using this meter until supplies run out then she will resume True Result.  Readings are averaging 130s.  Hypoglycemia is rare. Patient does demonstrate appropriate correction of hypoglycemia.  Patient denies signs and symptoms of neuropathy including numbness/tingling/burning and  symptoms of foot infection.  Patient is up to date on eye exam but is due for routine dental exam.      Lifestyle Assessment:  Diet - Diet has changed significantly over the past 3-4 weeks due to GI issues as noted above.  She is adhering to a very bland diet and cannot tolerate dairy, fried or greasy foods.  As a result she has lost 16 lbs and is being evaluated by gastro for possible causes.    Exercise - Prior to patients travel to [place] she was exercising twice weekly at [facility] fitness facility for 60 minutes each time.  Since losing her mother, traveling to [place] to be with family, and the onset of various GI symptoms, patient has not felt like exercising.  She hopes to resume exercise once GI testing is complete and symptoms have resolved.    Plan and Goals: 1)  Attempt to resume exercise twice weekly once GI issues resolve 2)  Continue making healthy dietary choices, limiting portions of starchy carbs 3)  Continue testing regularly 4)  Schedule a routine dental exam 5)  Follow-up in 3 months on Thursday October 27th at 8:30 am   Tilman Neat, PharmD [facility] [place]  [phone]

## 2015-01-14 NOTE — Progress Notes (Signed)
ATTENDING PHYSICIAN NOTE: I have reviewed the chart and agree with the plan as detailed above. Joliyah Lippens MD Pager [phone]  

## 2015-04-17 ENCOUNTER — Ambulatory Visit: Payer: 59 | Admitting: Pharmacist

## 2015-04-23 ENCOUNTER — Encounter: Payer: Self-pay | Admitting: Pharmacist

## 2015-04-23 ENCOUNTER — Ambulatory Visit (INDEPENDENT_AMBULATORY_CARE_PROVIDER_SITE_OTHER): Payer: Self-pay | Admitting: Family Medicine

## 2015-04-23 VITALS — BP 116/68 | Wt 176.0 lb

## 2015-04-23 DIAGNOSIS — E119 Type 2 diabetes mellitus without complications: Secondary | ICD-10-CM

## 2015-04-23 NOTE — Patient Instructions (Signed)
1)  Great job with resuming exercise, attempt to maintain this throughout the winter 2)  Continue to maintain making healthy dietary choices 3)  Continue testing regularly 4)  Great job with reduced A1c and weight! 5)  Follow-up in 3 months on Thursday Feb 2nd @ 8:30 am  Great to see you today!

## 2015-04-23 NOTE — Progress Notes (Signed)
  Subjective:  Patient is a 54 yo female with type 2 diabetes who presents today for 3 month follow-up as part of the employer-sponsored [facility] program. Current diabetes regimen includes Metformin. Patient also continues on daily statin.  Intolerant to ASA and not currently taking ACEi.  Most recent MD follow-up was with Dr. Osborne Casco, October 2016. Patient has a pending appt for Jan 2017.  Of note, patient's previous episode with GI illness has since resolved.  She is no longer having GI issues.  Labwork prior for October appt: A1c 6.9% BMET, CBC, Lipid panel, and urinalysis normal   Diabetes Assessment:  No changes to diabetes regimen at this time.  Patient does maintain good medication compliance. Most recent A1c was improved at 6.9% (prev 7.3%) which is now at goal of less than 7%.  Weight has decreased by an additional 6 lbs since last visit, due to dietary changes.  Patient did not bring meter today but is currently testing 1-2 times per day.  She is now using TrueResult meter, but will switch to TrueMetrix when current supply runs out.  She has a surplus of supplies as she was previously using [employer].  Per pateint report readings are averaging 120s, highest 137, no readings less than 100.  Hypoglycemia is rare and occurs only at work when she is late having a lunch break. Patient does demonstrate appropriate correction of hypoglycemia.  Patient denies signs and symptoms of neuropathy including numbness/tingling/burning and symptoms of foot infection.  Patient is up to date on eye and dental exams.     Lifestyle Assessment:  Diet - Diet is now back to normal after recent GI episode resolved.  However, she continues losing weight as she has committed to making healthier choices.  She is now avoiding eating after 7:30 pm.  When eating out she is now ordering water instead of soda and is better controlling portion sizes.  She is also eating less bread, only once weekly.  Eating  more oatmeal now for breakfast vs toast.  Goal to maintain changes.    Exercise - Has now resumed exercise.  Using [facility] fitness facility twice weekly, using treadmill, for 30-40 minutes each time.  Walking more at work now, overall getting more steps during the day.    Plan and Goals: 1)  Great job with resuming exercise, attempt to maintain this throughout the winter 2)  Continue to maintain making healthy dietary choices 3)  Continue testing regularly 4)  Great job with reduced A1c and weight! 5)  Follow-up in 3 months on Thursday Feb 2nd @ 8:30 am  Great to see you today!  Tilman Neat, PharmD [facility]  [phone]

## 2015-05-06 NOTE — Progress Notes (Signed)
I have reviewed this pharmacist's note and agree  

## 2015-07-02 DIAGNOSIS — H52223 Regular astigmatism, bilateral: Secondary | ICD-10-CM | POA: Diagnosis not present

## 2015-07-24 ENCOUNTER — Ambulatory Visit: Payer: 59 | Admitting: Pharmacist

## 2015-07-31 ENCOUNTER — Ambulatory Visit (INDEPENDENT_AMBULATORY_CARE_PROVIDER_SITE_OTHER): Payer: Self-pay | Admitting: Family Medicine

## 2015-07-31 ENCOUNTER — Encounter: Payer: Self-pay | Admitting: Pharmacist

## 2015-07-31 VITALS — BP 120/66 | Wt 178.0 lb

## 2015-07-31 DIAGNOSIS — E119 Type 2 diabetes mellitus without complications: Secondary | ICD-10-CM

## 2015-07-31 MED FILL — SIMVASTATIN 40 MG TABLET: 40 | 90 days supply | Qty: 90 | Fill #3

## 2015-07-31 MED FILL — metFORMIN HCL 500 MG TABS: 500 | 90 days supply | Qty: 180 | Fill #0

## 2015-07-31 MED FILL — TRIAMTERENE-HCTZ 37.5-25 MG: 37.5-25 | 90 days supply | Qty: 90 | Fill #3

## 2015-07-31 MED FILL — PANTOPRAZOLE SOD DR 40 MG T: 40 | 90 days supply | Qty: 180 | Fill #3

## 2015-07-31 NOTE — Progress Notes (Signed)
  Subjective:  Patient is a 55 yo female with type 2 diabetes who presents today for 3 month follow-up as part of the employer-sponsored [facility] program. Current diabetes regimen includes Metformin. Patient also continues on daily statin.  Intolerant to ASA and not currently taking ACEi.  Most recent MD follow-up was with Dr. Osborne Casco, October 2016. Patient had a pending appt for Jan 2017, but this was rescheduled by office due to MD being out of office.  Patient will now follow-up in March 2017.    Diabetes Assessment:  No changes to diabetes regimen at this time.  Patient does maintain good medication compliance. Most recent A1c was improved at 6.9% (prev 7.3%) which is now at goal of less than 7%.  Weight has increased slightly (2 lb) since last visit.  Patient has rejoined [place] diabetes program through [facility] and is once again using their glucometer/supplies.  She did not bring the meter, but can view all readings via smartphone app.  She is testing 1-2 times daily, fasting and/or after supper.  Readings are all 89-123, except for one outlier of 176 due to checking less than two hours after supper.  Program will last at least three months.      Hypoglycemia is rare and occurs only at work when she is late having a lunch break. Patient does demonstrate appropriate correction of hypoglycemia.  Patient denies signs and symptoms of neuropathy including numbness/tingling/burning and symptoms of foot infection.  Patient is up to date on eye and dental exams.  Eye exam in Jan 2017 was negative for retinopathy.     Lifestyle Assessment:  Diet - Patient has attempted to maintain a healthy diet and is limiting soda and carbs, especially breads. Food recall: Breakfast - egg and oatmeal or cereal (2% milk), coffee (creamer) Snack - only on occasion, mango or guava fruit Lunch - packs lunch mostly or gets soup from cafeteria, noodles with vegetables   Snack - none Supper - eating at home,  rice and stew, 6 inch sub subway carved [place], starch is either rice, noodles, or sometimes potatoes.  Veg spinach or brocolli.  No bread. Beverages - water, hot tea (sugar free)  Would like to be more consistent with timing of meals and snacks.  Work schedule sometimes makes it difficult to eat at normal interavals.    Exercise - Walking at work during lunch break and during work hours, also wearing a pedometer.  Usually on work days gets 6000-9000 steps per day.  Also going to fitness facility twice weekly for 60 minutes, treadmill and elliptical.  Will set a goal of achieving 10,000 steps per day.     Plan and Goals: 1)  Continue to exercise in fitness facility at least twice per week and attempt to reach goal of 10,000 steps per day on pedometer 2)  Attempt to improve consistency of meal times 3)  Continue testing regularly as directed by [facility] program 4)  Follow-up in 4 months on Thursday June 15th @ 10:00 am  Great to see you today!  Tilman Neat, PharmD [facility]  [phone]

## 2015-07-31 NOTE — Patient Instructions (Signed)
1)  Continue to exercise in fitness facility at least twice per week and attempt to reach goal of 10,000 steps per day on pedometer 2)  Attempt to improve consistency of meal times 3)  Continue testing regularly as directed by [employer] program 4)  Follow-up in 4 months on Thursday June 15th @ 10:00 am  Great to see you today!

## 2015-08-14 NOTE — Progress Notes (Signed)
I have reviewed this pharmacist's note and agree  

## 2015-11-26 MED FILL — TRIAMTERENE/HCTZ 37.5/25 TB: 37.5-25 | 90 days supply | Qty: 90 | Fill #0

## 2015-12-04 ENCOUNTER — Ambulatory Visit: Payer: Self-pay | Admitting: Pharmacist

## 2016-01-21 DIAGNOSIS — E669 Obesity, unspecified: Secondary | ICD-10-CM | POA: Diagnosis not present

## 2016-01-21 DIAGNOSIS — Z6828 Body mass index (BMI) 28.0-28.9, adult: Secondary | ICD-10-CM | POA: Diagnosis not present

## 2016-01-21 DIAGNOSIS — R05 Cough: Secondary | ICD-10-CM | POA: Diagnosis not present

## 2016-01-21 DIAGNOSIS — I10 Essential (primary) hypertension: Secondary | ICD-10-CM | POA: Diagnosis not present

## 2016-01-21 DIAGNOSIS — K219 Gastro-esophageal reflux disease without esophagitis: Secondary | ICD-10-CM | POA: Diagnosis not present

## 2016-02-13 DIAGNOSIS — E7800 Pure hypercholesterolemia, unspecified: Secondary | ICD-10-CM | POA: Diagnosis not present

## 2016-02-13 DIAGNOSIS — D509 Iron deficiency anemia, unspecified: Secondary | ICD-10-CM | POA: Diagnosis not present

## 2016-02-13 DIAGNOSIS — I10 Essential (primary) hypertension: Secondary | ICD-10-CM | POA: Diagnosis not present

## 2016-02-13 DIAGNOSIS — E668 Other obesity: Secondary | ICD-10-CM | POA: Diagnosis not present

## 2016-02-13 DIAGNOSIS — E119 Type 2 diabetes mellitus without complications: Secondary | ICD-10-CM | POA: Diagnosis not present

## 2016-02-13 DIAGNOSIS — K219 Gastro-esophageal reflux disease without esophagitis: Secondary | ICD-10-CM | POA: Diagnosis not present

## 2016-02-13 DIAGNOSIS — Z6828 Body mass index (BMI) 28.0-28.9, adult: Secondary | ICD-10-CM | POA: Diagnosis not present

## 2016-02-13 DIAGNOSIS — I272 Other secondary pulmonary hypertension: Secondary | ICD-10-CM | POA: Diagnosis not present

## 2016-02-13 MED FILL — metFORMIN HCL 500 MG TABS: 500 | 90 days supply | Qty: 180 | Fill #0

## 2016-02-13 MED FILL — SIMVASTATIN 40 MG TABLET: 40 | 90 days supply | Qty: 90 | Fill #0

## 2016-02-13 MED FILL — TRIAMTERENE/HCTZ 37.5/25 TB: 37.5-25 | 90 days supply | Qty: 90 | Fill #0

## 2016-02-18 MED FILL — PANTOPRAZOLE SOD DR 40 MG T: 40 | 90 days supply | Qty: 180 | Fill #0

## 2016-03-29 DIAGNOSIS — E119 Type 2 diabetes mellitus without complications: Secondary | ICD-10-CM | POA: Diagnosis not present

## 2016-03-29 DIAGNOSIS — Z0000 Encounter for general adult medical examination without abnormal findings: Secondary | ICD-10-CM | POA: Diagnosis not present

## 2016-04-12 DIAGNOSIS — I2789 Other specified pulmonary heart diseases: Secondary | ICD-10-CM | POA: Diagnosis not present

## 2016-04-12 DIAGNOSIS — E668 Other obesity: Secondary | ICD-10-CM | POA: Diagnosis not present

## 2016-04-12 DIAGNOSIS — K219 Gastro-esophageal reflux disease without esophagitis: Secondary | ICD-10-CM | POA: Diagnosis not present

## 2016-04-12 DIAGNOSIS — N182 Chronic kidney disease, stage 2 (mild): Secondary | ICD-10-CM | POA: Diagnosis not present

## 2016-04-12 DIAGNOSIS — Z1389 Encounter for screening for other disorder: Secondary | ICD-10-CM | POA: Diagnosis not present

## 2016-04-12 DIAGNOSIS — E7800 Pure hypercholesterolemia, unspecified: Secondary | ICD-10-CM | POA: Diagnosis not present

## 2016-04-12 DIAGNOSIS — D509 Iron deficiency anemia, unspecified: Secondary | ICD-10-CM | POA: Diagnosis not present

## 2016-04-12 DIAGNOSIS — I10 Essential (primary) hypertension: Secondary | ICD-10-CM | POA: Diagnosis not present

## 2016-04-12 DIAGNOSIS — E119 Type 2 diabetes mellitus without complications: Secondary | ICD-10-CM | POA: Diagnosis not present

## 2016-04-19 ENCOUNTER — Other Ambulatory Visit: Payer: Self-pay | Admitting: Internal Medicine

## 2016-04-19 DIAGNOSIS — Z1231 Encounter for screening mammogram for malignant neoplasm of breast: Secondary | ICD-10-CM

## 2016-05-06 ENCOUNTER — Ambulatory Visit
Admission: RE | Admit: 2016-05-06 | Discharge: 2016-05-06 | Disposition: A | Discharge status: Home / Self Care | Payer: 59 | Source: Ambulatory Visit | Attending: Internal Medicine | Admitting: Internal Medicine

## 2016-05-06 DIAGNOSIS — Z1231 Encounter for screening mammogram for malignant neoplasm of breast: Secondary | ICD-10-CM | POA: Diagnosis not present

## 2016-05-12 ENCOUNTER — Other Ambulatory Visit: Payer: Self-pay | Admitting: Internal Medicine

## 2016-05-12 DIAGNOSIS — R928 Other abnormal and inconclusive findings on diagnostic imaging of breast: Secondary | ICD-10-CM

## 2016-05-20 ENCOUNTER — Ambulatory Visit
Admission: RE | Admit: 2016-05-20 | Discharge: 2016-05-20 | Disposition: A | Discharge status: Home / Self Care | Payer: 59 | Source: Ambulatory Visit | Attending: Internal Medicine | Admitting: Internal Medicine

## 2016-05-20 ENCOUNTER — Other Ambulatory Visit: Payer: 59

## 2016-05-20 DIAGNOSIS — R928 Other abnormal and inconclusive findings on diagnostic imaging of breast: Secondary | ICD-10-CM

## 2016-05-20 DIAGNOSIS — R920 Mammographic microcalcification found on diagnostic imaging of breast: Secondary | ICD-10-CM | POA: Diagnosis not present

## 2016-05-31 MED FILL — TRIAMTERENE-HCTZ 37.5-25 MG: 37.5-25 | 90 days supply | Qty: 90 | Fill #1

## 2016-05-31 MED FILL — SIMVASTATIN 40 MG TABLET: 40 | 90 days supply | Qty: 90 | Fill #1

## 2016-05-31 MED FILL — PANTOPRAZOLE SOD DR 40 MG T: 40 | 90 days supply | Qty: 180 | Fill #1

## 2016-05-31 MED FILL — metFORMIN HCL 500 MG TABS: 500 | 90 days supply | Qty: 180 | Fill #1

## 2016-08-06 MED FILL — AZITHROMYCIN 250 MG TABLET: 250 | 5 days supply | Qty: 6 | Fill #0

## 2016-08-31 MED FILL — metFORMIN HCL 500 MG TABS: 500 | 90 days supply | Qty: 180 | Fill #2

## 2016-08-31 MED FILL — TRIAMTERENE-HCTZ 37.5-25 MG: 37.5-25 | 90 days supply | Qty: 90 | Fill #2

## 2016-08-31 MED FILL — PANTOPRAZOLE SOD DR 40 MG T: 40 | 90 days supply | Qty: 180 | Fill #2

## 2016-09-11 DIAGNOSIS — H52223 Regular astigmatism, bilateral: Secondary | ICD-10-CM | POA: Diagnosis not present

## 2016-10-22 DIAGNOSIS — I10 Essential (primary) hypertension: Secondary | ICD-10-CM | POA: Diagnosis not present

## 2016-10-22 DIAGNOSIS — K219 Gastro-esophageal reflux disease without esophagitis: Secondary | ICD-10-CM | POA: Diagnosis not present

## 2016-10-22 DIAGNOSIS — I2789 Other specified pulmonary heart diseases: Secondary | ICD-10-CM | POA: Diagnosis not present

## 2016-10-22 DIAGNOSIS — E668 Other obesity: Secondary | ICD-10-CM | POA: Diagnosis not present

## 2016-10-22 DIAGNOSIS — R808 Other proteinuria: Secondary | ICD-10-CM | POA: Diagnosis not present

## 2016-10-22 DIAGNOSIS — E7800 Pure hypercholesterolemia, unspecified: Secondary | ICD-10-CM | POA: Diagnosis not present

## 2016-10-22 DIAGNOSIS — D508 Other iron deficiency anemias: Secondary | ICD-10-CM | POA: Diagnosis not present

## 2016-10-22 DIAGNOSIS — E1129 Type 2 diabetes mellitus with other diabetic kidney complication: Secondary | ICD-10-CM | POA: Diagnosis not present

## 2016-10-22 DIAGNOSIS — N182 Chronic kidney disease, stage 2 (mild): Secondary | ICD-10-CM | POA: Diagnosis not present

## 2016-11-17 ENCOUNTER — Telehealth: Payer: Self-pay

## 2016-11-17 NOTE — Patient Outreach (Signed)
Called [facility] member to schedule a follow up appointment, there was no answer, left message to call me back. 

## 2016-12-03 MED FILL — TRIAMTERENE-HCTZ 37.5-25 MG: 37.5-25 | 90 days supply | Qty: 90 | Fill #3

## 2016-12-03 MED FILL — PANTOPRAZOLE SOD DR 40 MG T: 40 | 90 days supply | Qty: 180 | Fill #3

## 2016-12-03 MED FILL — metFORMIN HCL 500 MG TABS: 500 | 90 days supply | Qty: 180 | Fill #3

## 2017-01-06 ENCOUNTER — Telehealth: Payer: Self-pay

## 2017-01-06 NOTE — Patient Outreach (Signed)
Called [facility] member to schedule a follow up appointment, there was no answer, left message to call me back. 

## 2017-03-14 MED FILL — TRIAMTERENE/HCTZ 37.5/25 TB: 37.5-25 | 90 days supply | Qty: 90 | Fill #0

## 2017-03-14 MED FILL — metFORMIN HCL 500 MG TABS: 500 | 90 days supply | Qty: 180 | Fill #0

## 2017-03-14 MED FILL — PANTOPRAZOLE SOD DR 40 MG T: 40 | 90 days supply | Qty: 180 | Fill #0

## 2017-04-01 ENCOUNTER — Telehealth: Payer: Self-pay

## 2017-04-01 NOTE — Patient Outreach (Signed)
Called [facility] member to schedule a follow up appointment, there was no answer, left message to call me back. 

## 2017-04-06 DIAGNOSIS — E1129 Type 2 diabetes mellitus with other diabetic kidney complication: Secondary | ICD-10-CM | POA: Diagnosis not present

## 2017-04-06 DIAGNOSIS — Z0000 Encounter for general adult medical examination without abnormal findings: Secondary | ICD-10-CM | POA: Diagnosis not present

## 2017-04-06 DIAGNOSIS — Z7689 Persons encountering health services in other specified circumstances: Secondary | ICD-10-CM | POA: Diagnosis not present

## 2017-04-13 DIAGNOSIS — N912 Amenorrhea, unspecified: Secondary | ICD-10-CM | POA: Diagnosis not present

## 2017-04-13 DIAGNOSIS — Z0000 Encounter for general adult medical examination without abnormal findings: Secondary | ICD-10-CM | POA: Diagnosis not present

## 2017-04-13 DIAGNOSIS — N182 Chronic kidney disease, stage 2 (mild): Secondary | ICD-10-CM | POA: Diagnosis not present

## 2017-04-13 DIAGNOSIS — E668 Other obesity: Secondary | ICD-10-CM | POA: Diagnosis not present

## 2017-04-13 DIAGNOSIS — D508 Other iron deficiency anemias: Secondary | ICD-10-CM | POA: Diagnosis not present

## 2017-04-13 DIAGNOSIS — I10 Essential (primary) hypertension: Secondary | ICD-10-CM | POA: Diagnosis not present

## 2017-04-13 DIAGNOSIS — E7800 Pure hypercholesterolemia, unspecified: Secondary | ICD-10-CM | POA: Diagnosis not present

## 2017-04-13 DIAGNOSIS — E1129 Type 2 diabetes mellitus with other diabetic kidney complication: Secondary | ICD-10-CM | POA: Diagnosis not present

## 2017-04-13 DIAGNOSIS — Z1389 Encounter for screening for other disorder: Secondary | ICD-10-CM | POA: Diagnosis not present

## 2017-04-13 DIAGNOSIS — R808 Other proteinuria: Secondary | ICD-10-CM | POA: Diagnosis not present

## 2017-04-13 MED FILL — FREESTYLE LANCETS: 50 days supply | Qty: 100 | Fill #0

## 2017-04-13 MED FILL — ATORVASTATIN 80 MG TABLET: 80 | 90 days supply | Qty: 90 | Fill #0

## 2017-04-13 MED FILL — FREESTYLE LITE TEST STRIP: 50 days supply | Qty: 100 | Fill #0

## 2017-06-02 MED FILL — JANUMET XR 100-1,000 MG TAB: 100-1000 | 30 days supply | Qty: 30 | Fill #0

## 2017-06-03 MED FILL — TRIAMTERENE/HCTZ 37.5/25 TB: 37.5-25 | 90 days supply | Qty: 90 | Fill #1

## 2017-06-03 MED FILL — PANTOPRAZOLE SOD DR 40 MG T: 40 | 90 days supply | Qty: 180 | Fill #1

## 2017-07-01 MED FILL — JANUMET XR 100-1,000 MG TAB: 100-1000 | 30 days supply | Qty: 30 | Fill #1

## 2017-07-01 MED FILL — ATORVASTATIN 80 MG TABLET: 80 | 90 days supply | Qty: 90 | Fill #1

## 2017-08-01 MED FILL — JANUMET XR 100-1,000 MG TAB: 100-1000 | 30 days supply | Qty: 30 | Fill #2

## 2017-09-05 MED FILL — PANTOPRAZOLE SOD DR 40 MG T: 40 | 90 days supply | Qty: 180 | Fill #2

## 2017-09-05 MED FILL — TRIAMTERENE-HCTZ 37.5-25 MG: 37.5-25 | 90 days supply | Qty: 90 | Fill #2

## 2017-09-05 MED FILL — JANUMET XR 100-1,000 MG TAB: 100-1000 | 30 days supply | Qty: 30 | Fill #3

## 2017-09-12 DIAGNOSIS — E669 Obesity, unspecified: Secondary | ICD-10-CM | POA: Diagnosis not present

## 2017-09-12 DIAGNOSIS — K219 Gastro-esophageal reflux disease without esophagitis: Secondary | ICD-10-CM | POA: Diagnosis not present

## 2017-09-12 DIAGNOSIS — D509 Iron deficiency anemia, unspecified: Secondary | ICD-10-CM | POA: Diagnosis not present

## 2017-09-12 DIAGNOSIS — E1129 Type 2 diabetes mellitus with other diabetic kidney complication: Secondary | ICD-10-CM | POA: Diagnosis not present

## 2017-09-12 DIAGNOSIS — E7800 Pure hypercholesterolemia, unspecified: Secondary | ICD-10-CM | POA: Diagnosis not present

## 2017-09-12 DIAGNOSIS — I10 Essential (primary) hypertension: Secondary | ICD-10-CM | POA: Diagnosis not present

## 2017-09-12 DIAGNOSIS — I272 Pulmonary hypertension, unspecified: Secondary | ICD-10-CM | POA: Diagnosis not present

## 2017-09-12 DIAGNOSIS — R809 Proteinuria, unspecified: Secondary | ICD-10-CM | POA: Diagnosis not present

## 2017-09-12 DIAGNOSIS — N182 Chronic kidney disease, stage 2 (mild): Secondary | ICD-10-CM | POA: Diagnosis not present

## 2017-10-07 MED FILL — ATORVASTATIN 80 MG TABLET: 80 | 90 days supply | Qty: 90 | Fill #2

## 2017-10-07 MED FILL — JANUMET XR 100-1,000 MG TAB: 100-1000 | 30 days supply | Qty: 30 | Fill #4

## 2017-11-10 MED FILL — JANUMET XR 100-1,000 MG TAB: 100-1000 | 30 days supply | Qty: 30 | Fill #5

## 2017-12-14 MED FILL — PANTOPRAZOLE SOD DR 40 MG T: 40 | 90 days supply | Qty: 180 | Fill #0

## 2017-12-14 MED FILL — JANUMET XR 100-1,000 MG TAB: 100-1000 | 30 days supply | Qty: 30 | Fill #6

## 2017-12-14 MED FILL — TRIAMTERENE/HCTZ 37.5/25 TB: 37.5-25 | 90 days supply | Qty: 90 | Fill #0

## 2018-01-16 MED FILL — JANUMET XR 100-1,000 MG TAB: 100-1000 | 30 days supply | Qty: 30 | Fill #7

## 2018-01-16 MED FILL — ATORVASTATIN 80 MG TABLET: 80 | 90 days supply | Qty: 90 | Fill #3

## 2018-01-17 DIAGNOSIS — Z6830 Body mass index (BMI) 30.0-30.9, adult: Secondary | ICD-10-CM | POA: Diagnosis not present

## 2018-01-17 DIAGNOSIS — R05 Cough: Secondary | ICD-10-CM | POA: Diagnosis not present

## 2018-01-17 DIAGNOSIS — J309 Allergic rhinitis, unspecified: Secondary | ICD-10-CM | POA: Diagnosis not present

## 2018-01-17 DIAGNOSIS — J40 Bronchitis, not specified as acute or chronic: Secondary | ICD-10-CM | POA: Diagnosis not present

## 2018-01-17 MED FILL — predniSONE 5 MG TABS: 5 | 6 days supply | Qty: 21 | Fill #0

## 2018-01-17 MED FILL — FLUTICASONE PROP 50 MCG SPR: 50 | 30 days supply | Qty: 16 | Fill #0

## 2018-02-10 ENCOUNTER — Ambulatory Visit (INDEPENDENT_AMBULATORY_CARE_PROVIDER_SITE_OTHER)
Admission: RE | Admit: 2018-02-10 | Discharge: 2018-02-10 | Disposition: A | Discharge status: Home / Self Care | Payer: 59 | Source: Ambulatory Visit | Attending: Emergency Medicine | Admitting: Emergency Medicine

## 2018-02-10 ENCOUNTER — Ambulatory Visit: Payer: 59 | Admitting: Emergency Medicine

## 2018-02-10 ENCOUNTER — Encounter: Payer: Self-pay | Admitting: Emergency Medicine

## 2018-02-10 VITALS — BP 124/70 | HR 85 | Ht 66.0 in | Wt 194.0 lb

## 2018-02-10 DIAGNOSIS — I272 Pulmonary hypertension, unspecified: Secondary | ICD-10-CM | POA: Diagnosis not present

## 2018-02-10 DIAGNOSIS — R053 Chronic cough: Secondary | ICD-10-CM

## 2018-02-10 DIAGNOSIS — R05 Cough: Secondary | ICD-10-CM | POA: Diagnosis not present

## 2018-02-10 DIAGNOSIS — K219 Gastro-esophageal reflux disease without esophagitis: Secondary | ICD-10-CM | POA: Diagnosis not present

## 2018-02-10 NOTE — Assessment & Plan Note (Signed)
On protonix bid, was able to stop zantac

## 2018-02-10 NOTE — Patient Instructions (Signed)
CXR today We will check an echocardiogram  Continue your protonix twice a day as you have been taking You may need to restart allergy medication this Fall or Spring if you have recurrent congestion or cough.  Follow with Dr Lamonte Sakai next available after the echo to review together.

## 2018-02-10 NOTE — Assessment & Plan Note (Signed)
Questionable diagnosis, made in the 90s when she saw Dr. Arcelia Jew with [facility].  Apparently had an echocardiogram at that time but I do not have that result available.  She was started on Maxide at the time.  She did not have a right heart catheterization or any other work-up to her knowledge.  I think for starters she needs a repeat echocardiogram to determine whether her pulmonary pressures are abnormal.  We will tailor any subsequent work-up depending on that result.

## 2018-02-10 NOTE — Progress Notes (Signed)
Subjective:    Patient ID: Ariana Ramirez, female    DOB: 17-May-1961, 57 y.o.   MRN: [id number]  HPI 58 year old woman, never smoker, with a history of diabetes, hyperlipidemia, seasonal allergies, GERD. She has a hx of PAH that was apparently dx by TTE remotely, was started on maxide but never had R heart cath, other PAH eval or meds. ? Relevance here.    She is here today to discuss recent episode of prolonged cough.   She developed dry cough late June, was worst at night, non-productive. Developed some brownish mucous in the mornings. She started OTC anti-histamine (zyrtec and claritin) without much change. Added zantac to her existing protonix bid. The cough continued so she was seen, was treated with low-dose prednisone taper 01/23/2018, added flonase. Her cough improved and then stopped. She stopped the flonase, zantac, zyrtec. She continued the protonix. She did not receive antibiotics    Review of Systems  Constitutional: Negative for fever and unexpected weight change.  HENT: Negative for congestion, dental problem, ear pain, nosebleeds, postnasal drip, rhinorrhea, sinus pressure, sneezing, sore throat and trouble swallowing.   Eyes: Negative for redness and itching.  Respiratory: Positive for cough. Negative for chest tightness, shortness of breath and wheezing.   Cardiovascular: Negative for palpitations and leg swelling.  Gastrointestinal: Negative for nausea and vomiting.  Genitourinary: Negative for dysuria.  Musculoskeletal: Negative for joint swelling.  Skin: Negative for rash.  Neurological: Negative for headaches.  Hematological: Does not bruise/bleed easily.  Psychiatric/Behavioral: Negative for dysphoric mood. The patient is not nervous/anxious.     Past Medical History:  Diagnosis Date  . Diabetes mellitus   . Hyperlipidemia   . Pulmonary hypertension (HCC)      Family History  Problem Relation Age of Onset  . Hypertension Other   . Stroke Father   .  Hypertension Paternal Grandmother      Social History   Socioeconomic History  . Marital status: Married    Spouse name: Not on file  . Number of children: Not on file  . Years of education: Not on file  . Highest education level: Not on file  Occupational History  . Not on file  Social Needs  . Financial resource strain: Not on file  . Food insecurity:    Worry: Not on file    Inability: Not on file  . Transportation needs:    Medical: Not on file    Non-medical: Not on file  Tobacco Use  . Smoking status: Never Smoker  . Smokeless tobacco: Never Used  Substance and Sexual Activity  . Alcohol use: No  . Drug use: No  . Sexual activity: Not on file  Lifestyle  . Physical activity:    Days per week: Not on file    Minutes per session: Not on file  . Stress: Not on file  Relationships  . Social connections:    Talks on phone: Not on file    Gets together: Not on file    Attends religious service: Not on file    Active member of club or organization: Not on file    Attends meetings of clubs or organizations: Not on file    Relationship status: Not on file  . Intimate partner violence:    Fear of current or ex partner: Not on file    Emotionally abused: Not on file    Physically abused: Not on file    Forced sexual activity: Not on file  Other Topics Concern  .  Not on file  Social History Narrative  . 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 No known TB exposure or history  Allergies  Allergen Reactions  . Aspirin     GI intolerance  . Food     Apples, pears and some other fruits cause rash in mouth and throat     Outpatient Medications Prior to Visit  Medication Sig Dispense Refill  . metFORMIN (GLUCOPHAGE) 500 MG tablet Take 500 mg by mouth 2 (two) times daily with a meal.     . pantoprazole (PROTONIX) 40 MG tablet Take 40 mg by mouth 2 (two) times  daily.    . simvastatin (ZOCOR) 40 MG tablet Take 40 mg by mouth every evening.    . triamterene-hydrochlorothiazide (MAXZIDE-25) 37.5-25 MG per tablet Take 1 tablet by mouth daily.     No facility-administered medications prior to visit.         Objective:   Physical Exam Vitals:   02/10/18 1115  BP: 124/70  Pulse: 85  SpO2: 100%  Weight: 194 lb (88 kg)  Height: 5\' 6"  (1.676 m)   Gen: Pleasant, well-nourished, in no distress,  normal affect  ENT: No lesions,  mouth clear,  oropharynx clear, no postnasal drip  Neck: No JVD, no TMG, no carotid bruits  Lungs: No use of accessory muscles, no dullness to percussion, clear without rales or rhonchi  Cardiovascular: RRR, heart sounds normal, no murmur or gallops, no peripheral edema  Musculoskeletal: No deformities, no cyanosis or clubbing  Neuro: alert, non focal  Skin: Warm, no lesions or rash     Assessment & Plan:  Pulmonary hypertension (HCC) Questionable diagnosis, made in the 90s when she saw Dr. Arcelia Jew with [facility].  Apparently had an echocardiogram at that time but I do not have that result available.  She was started on Maxide at the time.  She did not have a right heart catheterization or any other work-up to her knowledge.  I think for starters she needs a repeat echocardiogram to determine whether her pulmonary pressures are abnormal.  We will tailor any subsequent work-up depending on that result.  GERD (gastroesophageal reflux disease) On protonix bid, was able to stop zantac  Chronic cough Single episode of prolonged cough that lasted for at least 6 weeks.  I suspect there were contributions of both her chronic GERD, usually well controlled, and superimposed low-grade allergic rhinitis.  Unclear whether it was allergies that started the process or possibly a URI.  She had brown mucus for a short period of time, question whether she also had a superimposed bacterial bronchitis.  She got better  with more aggressive treatment of both GERD and allergies, then a prednisone taper.  She needs a chest x-ray to rule out other potential causes.  I will defer pulmonary function testing at this time unless she develops a similar syndrome again in the future.  She will stay on her maintenance Protonix.  I have recommended to her that she may need to be on a scheduled allergy regimen as well, depending on how symptomatic she gets in the fall and spring.    Baltazar Apo, MD, PhD 02/10/2018, 11:51 AM [facility] [phone] or if no answer [phone]

## 2018-02-10 NOTE — Assessment & Plan Note (Signed)
Single episode of prolonged cough that lasted for at least 6 weeks.  I suspect there were contributions of both her chronic GERD, usually well controlled, and superimposed low-grade allergic rhinitis.  Unclear whether it was allergies that started the process or possibly a URI.  She had brown mucus for a short period of time, question whether she also had a superimposed bacterial bronchitis.  She got better with more aggressive treatment of both GERD and allergies, then a prednisone taper.  She needs a chest x-ray to rule out other potential causes.  I will defer pulmonary function testing at this time unless she develops a similar syndrome again in the future.  She will stay on her maintenance Protonix.  I have recommended to her that she may need to be on a scheduled allergy regimen as well, depending on how symptomatic she gets in the fall and spring.

## 2018-02-17 MED FILL — JANUMET XR 100-1,000 MG TAB: 100-1000 | 30 days supply | Qty: 30 | Fill #8

## 2018-02-27 ENCOUNTER — Other Ambulatory Visit: Payer: Self-pay | Admitting: Interventional Cardiology

## 2018-02-27 ENCOUNTER — Ambulatory Visit (INDEPENDENT_AMBULATORY_CARE_PROVIDER_SITE_OTHER): Payer: 59 | Admitting: Interventional Cardiology

## 2018-02-27 ENCOUNTER — Encounter: Payer: Self-pay | Admitting: *Deleted

## 2018-02-27 ENCOUNTER — Ambulatory Visit (HOSPITAL_COMMUNITY): Payer: 59 | Attending: Cardiology

## 2018-02-27 ENCOUNTER — Encounter (HOSPITAL_COMMUNITY): Payer: Self-pay

## 2018-02-27 ENCOUNTER — Other Ambulatory Visit: Payer: Self-pay

## 2018-02-27 ENCOUNTER — Encounter: Payer: Self-pay | Admitting: Interventional Cardiology

## 2018-02-27 VITALS — BP 142/80 | HR 70 | Ht 66.0 in | Wt 191.0 lb

## 2018-02-27 DIAGNOSIS — I272 Pulmonary hypertension, unspecified: Secondary | ICD-10-CM | POA: Insufficient documentation

## 2018-02-27 DIAGNOSIS — I078 Other rheumatic tricuspid valve diseases: Secondary | ICD-10-CM

## 2018-02-27 DIAGNOSIS — I079 Rheumatic tricuspid valve disease, unspecified: Secondary | ICD-10-CM | POA: Diagnosis not present

## 2018-02-27 DIAGNOSIS — E119 Type 2 diabetes mellitus without complications: Secondary | ICD-10-CM | POA: Insufficient documentation

## 2018-02-27 NOTE — Progress Notes (Signed)
Cardiology Office Note:    Date:  02/27/2018   ID:  Ariana Ramirez, DOB 05/05/1961, MRN [id number]  PCP:  Tisovec, Richard W, MD  Cardiologist:  No primary care provider on file.   Referring MD: Tisovec, Richard W, MD   Chief Complaint  Patient presents with  . Advice Only    Tricuspid Valve Mass    History of Present Illness:    Ariana Ramirez is a 57 y.o. female with a hx of pulmonary hypertension diagnosed in the 1990s who is referred now by Robert Byrum for repeat echo to reassess.  The patient was seeing Dr. Byrum because of chronic cough.  The history of prior pulmonary hypertension was uncovered and an echocardiogram was ordered to rule out persistent pulmonary hypertension.  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  As Doctor of the day I was asked to see the patient.  Cough has resolved.  She denies chills, fever, rash, weight loss, malaise, and anorexia.  No lower extremity swelling.  No hemoptysis.  She denies constitutional symptoms.  She works every day.    Past Medical History:  Diagnosis Date  . Diabetes mellitus   . Hyperlipidemia   . Pulmonary hypertension (HCC)     Past Surgical History:  Procedure Laterality Date  . APPENDECTOMY    . CESAREAN SECTION    . ECTOPIC PREGNANCY SURGERY    . TUBAL LIGATION      Current Medications: Current Meds  Medication Sig  . JANUMET XR 100-1000 MG TB24 Take 1 tablet by mouth daily.  . pantoprazole (PROTONIX) 40 MG tablet Take 40 mg by mouth 2 (two) times daily.  . simvastatin (ZOCOR) 40 MG tablet Take 40 mg by mouth every evening.  . triamterene-hydrochlorothiazide (MAXZIDE-25) 37.5-25 MG per tablet Take 1 tablet by mouth daily.     Allergies:   Aspirin and Food   Social History   Socioeconomic History  . Marital status: Married    Spouse name: Not on file  . Number of children: Not on file  . Years of education: Not on file  . Highest education level: Not on file    Occupational History  . Not on file  Social Needs  . Financial resource strain: Not on file  . Food insecurity:    Worry: Not on file    Inability: Not on file  . Transportation needs:    Medical: Not on file    Non-medical: Not on file  Tobacco Use  . Smoking status: Never Smoker  . Smokeless tobacco: Never Used  Substance and Sexual Activity  . Alcohol use: No  . Drug use: No  . Sexual activity: Not on file  Lifestyle  . Physical activity:    Days per week: Not on file    Minutes per session: Not on file  . Stress: Not on file  Relationships  . Social connections:    Talks on phone: Not on file    Gets together: Not on file    Attends religious service: Not on file    Active member of club or organization: Not on file    Attends meetings of clubs or organizations: Not on file    Relationship status: Not on file  Other Topics Concern  . Not on file  Social History Narrative  . Not on file     Family History: The patient's family history includes Hypertension in her mother, other, and paternal grandmother; Stroke in her father.  ROS:     Please see the history of present illness.    Joint swelling and easy bruising is noted.  She has also noted excessive sweating.  All other systems reviewed and are negative.  EKGs/Labs/Other Studies Reviewed:    The following studies were reviewed today:  Echocardiogram performed 02/27/2018 was reviewed and demonstrates a large tricuspid valve mass that is mobile that gives the impression of vegetation versus tumor.  Doubt thrombus.  EKG:  EKG is  ordered today.  The ekg ordered today demonstrates sinus rhythm and normal overall appearance.  Recent Labs: No results found for requested labs within last 8760 hours.  Recent Lipid Panel No results found for: CHOL, TRIG, HDL, CHOLHDL, VLDL, LDLCALC, LDLDIRECT  Physical Exam:    VS:  BP (!) 142/80   Pulse 70   Ht 5' 6" (1.676 m)   Wt 191 lb (86.6 kg)   BMI 30.83 kg/m     Wt  Readings from Last 3 Encounters:  02/27/18 191 lb (86.6 kg)  02/10/18 194 lb (88 kg)  07/31/15 178 lb (80.7 kg)     GEN:  Well nourished, well developed in no acute distress HEENT: Normal NECK: No JVD. LYMPHATICS: No lymphadenopathy CARDIAC: RRR, no murmur,  S4 gallop, no edema. VASCULAR: 2+ bilateral pulses.  No bruits. RESPIRATORY:  Clear to auscultation without rales, wheezing or rhonchi  ABDOMEN: Soft, non-tender, non-distended, No pulsatile mass, MUSCULOSKELETAL: No deformity  SKIN: Warm and dry NEUROLOGIC:  Alert and oriented x 3 PSYCHIATRIC:  Normal affect   ASSESSMENT:    1. Tricuspid valve mass    PLAN:    In order of problems listed above:  1. 2 sets of blood cultures, sed rate, CRP, and other general blood work including CBC and C met.  Transesophageal echo to be done to help further characterize the tricuspid valve mass.  Further evaluation will be dependent upon the database.   Medication Adjustments/Labs and Tests Ordered: Current medicines are reviewed at length with the patient today.  Concerns regarding medicines are outlined above.  No orders of the defined types were placed in this encounter.  No orders of the defined types were placed in this encounter.   There are no Patient Instructions on file for this visit.   Signed, Henry W Smith III, MD  02/27/2018 10:12 AM    [facility] 

## 2018-02-27 NOTE — H&P (View-Only) (Signed)
Cardiology Office Note:    Date:  02/27/2018   ID:  Soren Kersh, DOB 05/23/1961, MRN [id number]  PCP:  Tisovec, Richard W, MD  Cardiologist:  No primary care provider on file.   Referring MD: Tisovec, Richard W, MD   Chief Complaint  Patient presents with  . Advice Only    Tricuspid Valve Mass    History of Present Illness:    Ariana Ramirez is a 57 y.o. female with a hx of pulmonary hypertension diagnosed in the 1990s who is referred now by Robert Byrum for repeat echo to reassess.  The patient was seeing Dr. Byrum because of chronic cough.  The history of prior pulmonary hypertension was uncovered and an echocardiogram was ordered to rule out persistent pulmonary hypertension.  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  As Doctor of the day I was asked to see the patient.  Cough has resolved.  She denies chills, fever, rash, weight loss, malaise, and anorexia.  No lower extremity swelling.  No hemoptysis.  She denies constitutional symptoms.  She works every day.    Past Medical History:  Diagnosis Date  . Diabetes mellitus   . Hyperlipidemia   . Pulmonary hypertension (HCC)     Past Surgical History:  Procedure Laterality Date  . APPENDECTOMY    . CESAREAN SECTION    . ECTOPIC PREGNANCY SURGERY    . TUBAL LIGATION      Current Medications: Current Meds  Medication Sig  . JANUMET XR 100-1000 MG TB24 Take 1 tablet by mouth daily.  . pantoprazole (PROTONIX) 40 MG tablet Take 40 mg by mouth 2 (two) times daily.  . simvastatin (ZOCOR) 40 MG tablet Take 40 mg by mouth every evening.  . triamterene-hydrochlorothiazide (MAXZIDE-25) 37.5-25 MG per tablet Take 1 tablet by mouth daily.     Allergies:   Aspirin and Food   Social History   Socioeconomic History  . Marital status: Married    Spouse name: Not on file  . Number of children: Not on file  . Years of education: Not on file  . Highest education level: Not on file    Occupational History  . Not on file  Social Needs  . Financial resource strain: Not on file  . Food insecurity:    Worry: Not on file    Inability: Not on file  . Transportation needs:    Medical: Not on file    Non-medical: Not on file  Tobacco Use  . Smoking status: Never Smoker  . Smokeless tobacco: Never Used  Substance and Sexual Activity  . Alcohol use: No  . Drug use: No  . Sexual activity: Not on file  Lifestyle  . Physical activity:    Days per week: Not on file    Minutes per session: Not on file  . Stress: Not on file  Relationships  . Social connections:    Talks on phone: Not on file    Gets together: Not on file    Attends religious service: Not on file    Active member of club or organization: Not on file    Attends meetings of clubs or organizations: Not on file    Relationship status: Not on file  Other Topics Concern  . Not on file  Social History Narrative  . Not on file     Family History: The patient's family history includes Hypertension in her mother, other, and paternal grandmother; Stroke in her father.  ROS:     Please see the history of present illness.    Joint swelling and easy bruising is noted.  She has also noted excessive sweating.  All other systems reviewed and are negative.  EKGs/Labs/Other Studies Reviewed:    The following studies were reviewed today:  Echocardiogram performed 02/27/2018 was reviewed and demonstrates a large tricuspid valve mass that is mobile that gives the impression of vegetation versus tumor.  Doubt thrombus.  EKG:  EKG is  ordered today.  The ekg ordered today demonstrates sinus rhythm and normal overall appearance.  Recent Labs: No results found for requested labs within last 8760 hours.  Recent Lipid Panel No results found for: CHOL, TRIG, HDL, CHOLHDL, VLDL, LDLCALC, LDLDIRECT  Physical Exam:    VS:  BP (!) 142/80   Pulse 70   Ht 5' 6" (1.676 m)   Wt 191 lb (86.6 kg)   BMI 30.83 kg/m     Wt  Readings from Last 3 Encounters:  02/27/18 191 lb (86.6 kg)  02/10/18 194 lb (88 kg)  07/31/15 178 lb (80.7 kg)     GEN:  Well nourished, well developed in no acute distress HEENT: Normal NECK: No JVD. LYMPHATICS: No lymphadenopathy CARDIAC: RRR, no murmur,  S4 gallop, no edema. VASCULAR: 2+ bilateral pulses.  No bruits. RESPIRATORY:  Clear to auscultation without rales, wheezing or rhonchi  ABDOMEN: Soft, non-tender, non-distended, No pulsatile mass, MUSCULOSKELETAL: No deformity  SKIN: Warm and dry NEUROLOGIC:  Alert and oriented x 3 PSYCHIATRIC:  Normal affect   ASSESSMENT:    1. Tricuspid valve mass    PLAN:    In order of problems listed above:  1. 2 sets of blood cultures, sed rate, CRP, and other general blood work including CBC and C met.  Transesophageal echo to be done to help further characterize the tricuspid valve mass.  Further evaluation will be dependent upon the database.   Medication Adjustments/Labs and Tests Ordered: Current medicines are reviewed at length with the patient today.  Concerns regarding medicines are outlined above.  No orders of the defined types were placed in this encounter.  No orders of the defined types were placed in this encounter.   There are no Patient Instructions on file for this visit.   Signed, Reba Hulett W Mauriah Mcmillen III, MD  02/27/2018 10:12 AM    [facility] 

## 2018-02-27 NOTE — Progress Notes (Signed)
Ms. Hoge presented for echocardiogram this morning. A possibility of vegetation was noted. DOD (Dr. Tamala Julian) was notified and he is going to see her now.  Wyatt Mage, RDCS

## 2018-02-27 NOTE — Patient Instructions (Addendum)
Medication Instructions:  Your physician recommends that you continue on your current medications as directed. Please refer to the Current Medication list given to you today.   Labwork: Lab work to be done today--CMET, CBC, sed rate, CRP and blood cultures times 2.  To [facility] done at [facility] on first floor.   Testing/Procedures: Your physician has requested that you have a TEE. During a TEE, sound waves are used to create images of your heart. It provides your doctor with information about the size and shape of your heart and how well your heart's chambers and valves are working. In this test, a transducer is attached to the end of a flexible tube that's guided down your throat and into your esophagus (the tube leading from you mouth to your stomach) to get a more detailed image of your heart. You are not awake for the procedure. Please see the instruction sheet given to you today. For further information please visit [email or web address].   Scheduled for 02/28/18  Follow-Up: Your physician recommends that you schedule a follow-up appointment in: about 4 weeks with Dr. Tamala Julian    Any Other Special Instructions Will Be Listed Below (If Applicable).         If you need a refill on your cardiac medications before your next appointment, please call your pharmacy.

## 2018-02-28 ENCOUNTER — Ambulatory Visit (HOSPITAL_BASED_OUTPATIENT_CLINIC_OR_DEPARTMENT_OTHER)
Admission: RE | Admit: 2018-02-28 | Discharge: 2018-02-28 | Disposition: A | Discharge status: Home / Self Care | Payer: 59 | Source: Ambulatory Visit | Attending: Cardiology | Admitting: Cardiology

## 2018-02-28 ENCOUNTER — Ambulatory Visit (HOSPITAL_COMMUNITY)
Admission: RE | Admit: 2018-02-28 | Discharge: 2018-02-28 | Disposition: A | Discharge status: Home / Self Care | Payer: 59 | Source: Ambulatory Visit | Attending: Cardiology | Admitting: Cardiology

## 2018-02-28 ENCOUNTER — Encounter (HOSPITAL_COMMUNITY)
Admission: RE | Disposition: A | Discharge status: Home / Self Care | Payer: Self-pay | Source: Ambulatory Visit | Attending: Cardiology

## 2018-02-28 ENCOUNTER — Encounter (HOSPITAL_COMMUNITY): Payer: Self-pay

## 2018-02-28 ENCOUNTER — Other Ambulatory Visit: Payer: Self-pay

## 2018-02-28 DIAGNOSIS — I272 Pulmonary hypertension, unspecified: Secondary | ICD-10-CM | POA: Insufficient documentation

## 2018-02-28 DIAGNOSIS — Z8249 Family history of ischemic heart disease and other diseases of the circulatory system: Secondary | ICD-10-CM | POA: Diagnosis not present

## 2018-02-28 DIAGNOSIS — E119 Type 2 diabetes mellitus without complications: Secondary | ICD-10-CM | POA: Insufficient documentation

## 2018-02-28 DIAGNOSIS — I340 Nonrheumatic mitral (valve) insufficiency: Secondary | ICD-10-CM

## 2018-02-28 DIAGNOSIS — R931 Abnormal findings on diagnostic imaging of heart and coronary circulation: Secondary | ICD-10-CM | POA: Diagnosis not present

## 2018-02-28 DIAGNOSIS — R05 Cough: Secondary | ICD-10-CM | POA: Insufficient documentation

## 2018-02-28 DIAGNOSIS — I078 Other rheumatic tricuspid valve diseases: Secondary | ICD-10-CM

## 2018-02-28 DIAGNOSIS — E785 Hyperlipidemia, unspecified: Secondary | ICD-10-CM | POA: Diagnosis not present

## 2018-02-28 HISTORY — PX: TEE WITHOUT CARDIOVERSION: SHX5443

## 2018-02-28 LAB — GLUCOSE, CAPILLARY: Glucose-Capillary: 129 mg/dL — ABNORMAL HIGH (ref 70–99)

## 2018-02-28 SURGERY — ECHOCARDIOGRAM, TRANSESOPHAGEAL
Anesthesia: Moderate Sedation

## 2018-02-28 MED ORDER — FENTANYL CITRATE (PF) 100 MCG/2ML IJ SOLN
INTRAMUSCULAR | Status: AC
  Filled 2018-02-28: qty 2

## 2018-02-28 MED ORDER — MIDAZOLAM HCL 5 MG/ML IJ SOLN
INTRAMUSCULAR | Status: AC
  Filled 2018-02-28: qty 2

## 2018-02-28 MED ORDER — FENTANYL CITRATE (PF) 100 MCG/2ML IJ SOLN
INTRAMUSCULAR | Status: DC | PRN
  Administered 2018-02-28 (×2): 12.5 ug via INTRAVENOUS
  Administered 2018-02-28: 25 ug via INTRAVENOUS

## 2018-02-28 MED ORDER — BUTAMBEN-TETRACAINE-BENZOCAINE 2-2-14 % EX AERO
INHALATION_SPRAY | CUTANEOUS | Status: DC | PRN
  Administered 2018-02-28: 2 via TOPICAL

## 2018-02-28 MED ORDER — SODIUM CHLORIDE 0.9 % IV SOLN
INTRAVENOUS | Status: DC
  Administered 2018-02-28: 07:00:00 via INTRAVENOUS

## 2018-02-28 MED ORDER — SODIUM CHLORIDE 0.9 % IV SOLN: INTRAVENOUS | Status: DC

## 2018-02-28 MED ORDER — DIPHENHYDRAMINE HCL 50 MG/ML IJ SOLN
INTRAMUSCULAR | Status: AC
  Filled 2018-02-28: qty 1

## 2018-02-28 MED ORDER — MIDAZOLAM HCL 10 MG/2ML IJ SOLN
INTRAMUSCULAR | Status: DC | PRN
  Administered 2018-02-28: 2 mg via INTRAVENOUS
  Administered 2018-02-28 (×2): 1 mg via INTRAVENOUS

## 2018-02-28 NOTE — CV Procedure (Signed)
    PROCEDURE NOTE:  Procedure:  Transesophageal echocardiogram Operator:  Fransico Him, MD Indications:  TV mass Complications: None  During this procedure the patient is administered a total of Versed 4 mg and Fentanyl 50 mg to achieve and maintain moderate conscious sedation.  The patient's heart rate, blood pressure, and oxygen saturation are monitored continuously during the procedure. The period of conscious sedation is 20 minutes, of which I was present face-to-face 100% of this time.  Results: Normal LV size and function E 60-65% Normal RV size and function Normal RA Normal LA and LA appendage There is a 1 x 1cm symmetrical mobile density on the RA side of the TV leaflet with mild TR.  Mass most consistent with fibroelastoma. Normal PV Normal MV with mild MR Normal trileaflet AV Normal interatrial septum with no evidence of shunt by colorflow dopper  Normal thoracic and ascending aorta.  The patient tolerated the procedure well and was transferred back to their room in stable condition.  Signed: Fransico Him, MD [facility]

## 2018-02-28 NOTE — Progress Notes (Addendum)
Cardiology Office Note:    Date:  03/01/2018   ID:  Ariana Ramirez, DOB 21-Nov-1960, MRN [id number]  PCP:  Haywood Pao, MD  Cardiologist:  No primary care provider on file.   Referring MD: Haywood Pao, MD   Chief Complaint  Patient presents with  . 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  Normal blood work including CRP and Westergren sedimentation rate. Here today to plan further evaluation and potential treatment options for PFE.  Totally asymptomatic relative to CV symptoms.  Past Medical History:  Diagnosis Date  . Diabetes mellitus   . Hyperlipidemia   . Pulmonary hypertension ([place])     Past Surgical History:  Procedure Laterality Date  . APPENDECTOMY    . CESAREAN SECTION    . ECTOPIC PREGNANCY SURGERY    . TEE WITHOUT CARDIOVERSION N/A 02/28/2018   Procedure: TRANSESOPHAGEAL ECHOCARDIOGRAM (TEE);  Surgeon: Sueanne Margarita, MD;  Location: [facility] ENDOSCOPY;  Service: Cardiovascular;  Laterality: N/A;  . TUBAL LIGATION      Current Medications: Current Meds  Medication Sig  . fluticasone (FLONASE) 50 MCG/ACT nasal spray Place 2 sprays into both nostrils daily as needed for allergies.  Marland Kitchen JANUMET XR [phone] MG TB24 Take 1 tablet by mouth daily.  . Multiple Vitamin (MULTIVITAMIN WITH MINERALS) TABS tablet Take 1 tablet by mouth daily.  . pantoprazole (PROTONIX) 40 MG tablet Take 40 mg by mouth 2 (two) times daily.  . simvastatin (ZOCOR) 40 MG tablet Take 40 mg by mouth every evening.  . triamterene-hydrochlorothiazide (MAXZIDE-25) 37.5-25 MG per tablet Take 1 tablet by mouth daily.     Allergies:   Aspirin; Pork-derived products; and Food   Social History    Socioeconomic History  . Marital status: Married    Spouse name: Not on file  . Number of children: Not on file  . Years of education: Not on file  . Highest education level: Not on file  Occupational History  . Not on file  Social Needs  . Financial resource strain: Not on file  . Food insecurity:    Worry: Not on file    Inability: Not on file  . Transportation needs:    Medical: Not on file    Non-medical: Not on file  Tobacco Use  . Smoking status: Never Smoker  . Smokeless tobacco: Never Used  Substance and Sexual Activity  . Alcohol use: No  . Drug use: No  . Sexual activity: Not on file  Lifestyle  . Physical activity:    Days per week: Not on file    Minutes per session: Not on file  . Stress: Not on file  Relationships  . Social connections:    Talks on phone: Not on file    Gets together: Not on file    Attends religious service: Not on file    Active member of club or organization: Not on file    Attends meetings of clubs or organizations: Not on file    Relationship status: Not on file  Other Topics Concern  . Not on file  Social History Narrative  . Not on file     Family History: The patient's family history includes  Hypertension in her mother, other, and paternal grandmother; Stroke in her father.  ROS:   Please see the history of present illness.    No new All other systems reviewed and are negative.  EKGs/Labs/Other Studies Reviewed:    The following studies were reviewed today: None  EKG:  EKG is not ordered today.   Recent Labs: 02/27/2018: ALT 17; BUN 17; Creatinine, Ser 1.04; Hemoglobin 12.2; Platelets 321; Potassium 4.1; Sodium 139  Recent Lipid Panel No results found for: CHOL, TRIG, HDL, CHOLHDL, VLDL, LDLCALC, LDLDIRECT  Physical Exam:    VS:  BP 120/68   Pulse 76   Ht 5\' 6"  (1.676 m)   Wt 190 lb 12.8 oz (86.5 kg)   SpO2 95%   BMI 30.80 kg/m     Wt Readings from Last 3 Encounters:  03/01/18 190 lb 12.8 oz (86.5 kg)   02/28/18 191 lb (86.6 kg)  02/27/18 191 lb (86.6 kg)     GEN: Healthy Well nourished, well developed in no acute distress HEENT: Normal NECK: No JVD. LYMPHATICS: No lymphadenopathy CARDIAC: RRR, nomurmur, no gallop, no edema. VASCULAR 2+ radial pulses. no bruits. RESPIRATORY:  Clear to auscultation without rales, wheezing or rhonchi  ABDOMEN: Soft, non-tender, non-distended, No pulsatile mass, MUSCULOSKELETAL: No deformity  SKIN: Warm and dry NEUROLOGIC:  Alert and oriented x 3 PSYCHIATRIC:  Normal affect   ASSESSMENT:    1. Tricuspid valve mass   2. Type 2 diabetes mellitus without complication, without long-term current use of insulin (HCC)    PLAN:    In order of problems listed above:  1. 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  Will refer for consideration of resection versus watchful waiting.  Have spoken with Dr. Darylene Price.   Medication Adjustments/Labs and Tests Ordered: Current medicines are reviewed at length with the patient today.  Concerns regarding medicines are outlined above.  Orders Placed This Encounter  Procedures  . Ambulatory referral to Cardiothoracic Surgery   No orders of the defined types were placed in this encounter.   Patient Instructions  Medication Instructions:  Your physician recommends that you continue on your current medications as directed. Please refer to the Current Medication list given to you today.   Labwork: none  Testing/Procedures: none  Follow-Up: You have been referred to Dr. Roxy Manns at [place] [facility] Thor[facility]  Your physician wants you to follow-up in: 6 months with Dr. Tamala Julian.  You will receive a reminder letter in the mail two months in advance. If you don't receive a letter, please call our office to schedule the follow-up appointment.   Any Other Special Instructions Will Be Listed Below (If Applicable).     If you need a refill on your cardiac medications before your next appointment,  please call your pharmacy.      Signed, Sinclair Grooms, MD  03/01/2018 8:05 PM    [place]

## 2018-02-28 NOTE — Discharge Instructions (Signed)

## 2018-02-28 NOTE — Interval H&P Note (Signed)
History and Physical Interval Note:  02/28/2018 7:58 AM  Ariana Ramirez  has presented today for surgery, with the diagnosis of MASS ON TRICUSBID VALVE  The various methods of treatment have been discussed with the patient and family. After consideration of risks, benefits and other options for treatment, the patient has consented to  Procedure(s): TRANSESOPHAGEAL ECHOCARDIOGRAM (TEE) (N/A) as a surgical intervention .  The patient's history has been reviewed, patient examined, no change in status, stable for surgery.  I have reviewed the patient's chart and labs.  Questions were answered to the patient's satisfaction.     Fransico Him

## 2018-02-28 NOTE — Progress Notes (Signed)
  Echocardiogram Echocardiogram Transesophageal has been performed.  Ariana Ramirez 02/28/2018, 10:18 AM

## 2018-03-01 ENCOUNTER — Ambulatory Visit: Payer: 59 | Admitting: Interventional Cardiology

## 2018-03-01 ENCOUNTER — Encounter: Payer: Self-pay | Admitting: Interventional Cardiology

## 2018-03-01 VITALS — BP 120/68 | HR 76 | Ht 66.0 in | Wt 190.8 lb

## 2018-03-01 DIAGNOSIS — E119 Type 2 diabetes mellitus without complications: Secondary | ICD-10-CM

## 2018-03-01 DIAGNOSIS — I079 Rheumatic tricuspid valve disease, unspecified: Secondary | ICD-10-CM

## 2018-03-01 DIAGNOSIS — I078 Other rheumatic tricuspid valve diseases: Secondary | ICD-10-CM

## 2018-03-01 NOTE — Patient Instructions (Signed)
Medication Instructions:  Your physician recommends that you continue on your current medications as directed. Please refer to the Current Medication list given to you today.   Labwork: none  Testing/Procedures: none  Follow-Up: You have been referred to Dr. Roxy Manns at [place] [facility]  Your physician wants you to follow-up in: 6 months with Dr. Tamala Julian.  You will receive a reminder letter in the mail two months in advance. If you don't receive a letter, please call our office to schedule the follow-up appointment.   Any Other Special Instructions Will Be Listed Below (If Applicable).     If you need a refill on your cardiac medications before your next appointment, please call your pharmacy.

## 2018-03-05 LAB — CBC
HEMATOCRIT: 36.3 % (ref 34.0–46.6)
HEMOGLOBIN: 12.2 g/dL (ref 11.1–15.9)
MCH: 29.6 pg (ref 26.6–33.0)
MCHC: 33.6 g/dL (ref 31.5–35.7)
MCV: 88 fL (ref 79–97)
Platelets: 321 10*3/uL (ref 150–450)
RBC: 4.12 x10E6/uL (ref 3.77–5.28)
RDW: 13.8 % (ref 12.3–15.4)
WBC: 5.6 10*3/uL (ref 3.4–10.8)

## 2018-03-05 LAB — COMPREHENSIVE METABOLIC PANEL
A/G RATIO: 1.5 (ref 1.2–2.2)
ALT: 17 IU/L (ref 0–32)
AST: 17 IU/L (ref 0–40)
Albumin: 4.8 g/dL (ref 3.5–5.5)
Alkaline Phosphatase: 107 IU/L (ref 39–117)
BUN/Creatinine Ratio: 16 (ref 9–23)
BUN: 17 mg/dL (ref 6–24)
Bilirubin Total: 0.5 mg/dL (ref 0.0–1.2)
CALCIUM: 9.8 mg/dL (ref 8.7–10.2)
CO2: 26 mmol/L (ref 20–29)
CREATININE: 1.04 mg/dL — AB (ref 0.57–1.00)
Chloride: 96 mmol/L (ref 96–106)
GFR calc Af Amer: 70 mL/min/{1.73_m2} (ref >59)
GFR, EST NON AFRICAN AMERICAN: 61 mL/min/{1.73_m2} (ref >59)
GLUCOSE: 104 mg/dL — AB (ref 65–99)
Globulin, Total: 3.3 g/dL (ref 1.5–4.5)
Potassium: 4.1 mmol/L (ref 3.5–5.2)
Sodium: 139 mmol/L (ref 134–144)
TOTAL PROTEIN: 8.1 g/dL (ref 6.0–8.5)

## 2018-03-05 LAB — CULTURE, BLOOD (SINGLE)

## 2018-03-05 LAB — SEDIMENTATION RATE: Sed Rate: 26 mm/hr (ref 0–40)

## 2018-03-05 LAB — C-REACTIVE PROTEIN: CRP: 5 mg/L (ref 0–10)

## 2018-03-07 ENCOUNTER — Telehealth: Payer: Self-pay | Admitting: Emergency Medicine

## 2018-03-07 NOTE — Telephone Encounter (Signed)
lmtcb for pt.  

## 2018-03-08 NOTE — Telephone Encounter (Signed)
Called and left a detailed msg letting her know she does need to keep ov with RB to discuss results of ECHO

## 2018-03-13 ENCOUNTER — Ambulatory Visit: Payer: 59 | Admitting: Emergency Medicine

## 2018-03-13 ENCOUNTER — Encounter: Payer: Self-pay | Admitting: Emergency Medicine

## 2018-03-13 DIAGNOSIS — I079 Rheumatic tricuspid valve disease, unspecified: Secondary | ICD-10-CM | POA: Diagnosis not present

## 2018-03-13 DIAGNOSIS — K219 Gastro-esophageal reflux disease without esophagitis: Secondary | ICD-10-CM | POA: Diagnosis not present

## 2018-03-13 DIAGNOSIS — I272 Pulmonary hypertension, unspecified: Secondary | ICD-10-CM

## 2018-03-13 DIAGNOSIS — I078 Other rheumatic tricuspid valve diseases: Secondary | ICD-10-CM

## 2018-03-13 DIAGNOSIS — R05 Cough: Secondary | ICD-10-CM | POA: Diagnosis not present

## 2018-03-13 DIAGNOSIS — R053 Chronic cough: Secondary | ICD-10-CM

## 2018-03-13 NOTE — Assessment & Plan Note (Signed)
Planning to discuss with Dr Roxy Manns this week.

## 2018-03-13 NOTE — Patient Instructions (Signed)
Please continue your Protonix twice a day as you have been taking it. Agree with starting an over-the-counter antihistamine in the fall and spring months. Agree with discussing your echocardiogram results with Dr. Roxy Manns Please discuss with Dr. Tamala Julian whether you should continue Maxide. Please follow with Dr. Lamonte Sakai if you have any increase in your cough or change in your breathing.  If so we will talk about additional testing including pulmonary function tests or possible airway inspection.

## 2018-03-13 NOTE — Assessment & Plan Note (Signed)
Continue rx for GERD and allergic rhinitis as ordered/ .

## 2018-03-13 NOTE — Assessment & Plan Note (Signed)
Not evident on her most recent echocardiogram.  Normal RV function.  Question whether the newly identified tricuspid valve polyp may have contributed to some of her earlier echocardiogram abnormalities.  Unclear.  I do not think she needs any further evaluation for PAH at this time.  Also she asked the question of whether she needs to stay on Maxide which was apparently started at the time of her original echo. I've asked her to discuss this with Dr. Tamala Julian when they follow-up

## 2018-03-13 NOTE — Progress Notes (Signed)
Subjective:    Patient ID: Ariana Ramirez, female    DOB: 01-22-1961, 57 y.o.   MRN: [id number]  HPI 57 year old woman, never smoker, with a history of diabetes, hyperlipidemia, seasonal allergies, GERD. She has a hx of PAH that was apparently dx by TTE remotely, was started on maxide but never had R heart cath, other PAH eval or meds. ? Relevance here.    She is here today to discuss recent episode of prolonged cough.   She developed dry cough late June, was worst at night, non-productive. Developed some brownish mucous in the mornings. She started OTC anti-histamine (zyrtec and claritin) without much change. Added zantac to her existing protonix bid. The cough continued so she was seen, was treated with low-dose prednisone taper 01/23/2018, added flonase. Her cough improved and then stopped. She stopped the flonase, zantac, zyrtec. She continued the protonix. She did not receive antibiotics   ROV 03/13/18 --patient follows up today for chronic cough evaluation.  Suspect contribution from chronic GERD and some low-grade allergic rhinitis.  There was also some question of possible PAH on the remote echocardiogram so I repeated this on 02/27/2018.  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  Her PASP was estimated at 30 mmHg.  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 She is now having some intermittent tickle in her throat, the cough is much less. She is on protonix bid. Does not seem to have any breakthrough GERD, following with Dr Wallis Mart. Uses anti-histamine prn. CXR 02/10/18 normal. She is on Maxide.    Review of Systems  Constitutional: Negative for fever and unexpected weight change.  HENT: Negative for congestion, dental problem, ear pain, nosebleeds, postnasal drip, rhinorrhea, sinus pressure,  sneezing, sore throat and trouble swallowing.   Eyes: Negative for redness and itching.  Respiratory: Positive for cough. Negative for chest tightness, shortness of breath and wheezing.   Cardiovascular: Negative for palpitations and leg swelling.  Gastrointestinal: Negative for nausea and vomiting.  Genitourinary: Negative for dysuria.  Musculoskeletal: Negative for joint swelling.  Skin: Negative for rash.  Neurological: Negative for headaches.  Hematological: Does not bruise/bleed easily.  Psychiatric/Behavioral: Negative for dysphoric mood. The patient is not nervous/anxious.     Past Medical History:  Diagnosis Date  . Diabetes mellitus   . Hyperlipidemia   . Pulmonary hypertension (HCC)      Family History  Problem Relation Age of Onset  . Stroke Father   . Hypertension Paternal Grandmother   . Hypertension Other   . Hypertension Mother      Social History   Socioeconomic History  . Marital status: Married    Spouse name: Not on file  . Number of children: Not on file  . Years of education: Not on file  . Highest education level: Not on file  Occupational History  . Not on file  Social Needs  . Financial resource strain: Not on file  . Food insecurity:    Worry: Not on file    Inability: Not on file  . Transportation needs:    Medical: Not on file    Non-medical: Not on file  Tobacco Use  . Smoking status: Never Smoker  . Smokeless tobacco: Never Used  Substance and Sexual Activity  . Alcohol use: No  . Drug use: No  .  Sexual activity: Not on file  Lifestyle  . Physical activity:    Days per week: Not on file    Minutes per session: Not on file  . Stress: Not on file  Relationships  . Social connections:    Talks on phone: Not on file    Gets together: Not on file    Attends religious service: Not on file    Active member of club or organization: Not on file    Attends meetings of clubs or organizations: Not on file    Relationship status: Not on  file  . Intimate partner violence:    Fear of current or ex partner: Not on file    Emotionally abused: Not on file    Physically abused: Not on file    Forced sexual activity: Not on file  Other Topics Concern  . Not on file  Social History Narrative  . 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 No known TB exposure or history  Allergies  Allergen Reactions  . Aspirin     GI intolerance  . Pork-Derived Musician preference  . Food Rash    Fresh apples, pears, peaches and nectarines cause rash on mouth and in throat (able to tolerate canned fruit)     Outpatient Medications Prior to Visit  Medication Sig Dispense Refill  . fluticasone (FLONASE) 50 MCG/ACT nasal spray Place 2 sprays into both nostrils daily as needed for allergies.    Marland Kitchen JANUMET XR [phone] MG TB24 Take 1 tablet by mouth daily.  3  . Multiple Vitamin (MULTIVITAMIN WITH MINERALS) TABS tablet Take 1 tablet by mouth daily.    . pantoprazole (PROTONIX) 40 MG tablet Take 40 mg by mouth 2 (two) times daily.    . simvastatin (ZOCOR) 40 MG tablet Take 40 mg by mouth every evening.    . triamterene-hydrochlorothiazide (MAXZIDE-25) 37.5-25 MG per tablet Take 1 tablet by mouth daily.     No facility-administered medications prior to visit.         Objective:   Physical Exam Vitals:   03/13/18 1208  BP: 134/70  Pulse: 84  SpO2: 100%  Weight: 192 lb (87.1 kg)  Height: 5\' 6"  (1.676 m)   Gen: Pleasant, well-nourished, in no distress,  normal affect  ENT: No lesions,  mouth clear,  oropharynx clear, no postnasal drip  Neck: No JVD, no stridor  Lungs: No use of accessory muscles, no crackles or wheze  Cardiovascular: RRR, heart sounds normal, no murmur or gallops, no peripheral edema  Musculoskeletal: No deformities, no cyanosis or clubbing  Neuro: alert, non focal  Skin: Warm, no lesions or  rash     Assessment & Plan:  Pulmonary hypertension (HCC) Not evident on her most recent echocardiogram.  Normal RV function.  Question whether the newly identified tricuspid valve polyp may have contributed to some of her earlier echocardiogram abnormalities.  Unclear.  I do not think she needs any further evaluation for PAH at this time.  Also she asked the question of whether she needs to stay on Maxide which was apparently started at the time of her original echo. I've asked her to discuss this with Dr. Tamala Julian when they follow-up  GERD (gastroesophageal reflux disease) Continue protonix as ordered  Chronic cough Continue rx for GERD and allergic rhinitis as ordered/ .  Tricuspid valve mass Planning  to discuss with Dr Roxy Manns this week.   Baltazar Apo, MD, PhD 03/13/2018, 12:28 PM [facility] [phone] or if no answer [phone]

## 2018-03-13 NOTE — Assessment & Plan Note (Signed)
Continue protonix as ordered. °

## 2018-03-14 ENCOUNTER — Other Ambulatory Visit: Payer: Self-pay

## 2018-03-14 ENCOUNTER — Institutional Professional Consult (permissible substitution): Payer: 59 | Admitting: Thoracic Surgery (Cardiothoracic Vascular Surgery)

## 2018-03-14 ENCOUNTER — Encounter: Payer: Self-pay | Admitting: Thoracic Surgery (Cardiothoracic Vascular Surgery)

## 2018-03-14 VITALS — BP 132/70 | HR 74 | Resp 16 | Ht 66.0 in | Wt 192.0 lb

## 2018-03-14 DIAGNOSIS — I079 Rheumatic tricuspid valve disease, unspecified: Secondary | ICD-10-CM

## 2018-03-14 DIAGNOSIS — I078 Other rheumatic tricuspid valve diseases: Secondary | ICD-10-CM

## 2018-03-14 NOTE — Progress Notes (Signed)
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 surgical options at length including conventional median sternotomy, minimally invasive approach via right minithoracotomy, or possible vacuum extraction using a novel endovascular device.  Alternative surgical approaches have been discussed including a comparison between conventional sternotomy and minimally-invasive techniques.  The relative risks and benefits of each have been reviewed as they pertain to the patient's specific circumstances, and all of her questions have been addressed.  Risks associated with surgery and expectations for the patient's postoperative convalescence have been discussed.  We tentatively plan  to proceed with elective surgical resection of the mass adherent to the patient's tricuspid valve using minimally invasive technique via right anterior minithoracotomy approach on April 07, 2018.  The patient will undergo cardiac gated CT angiogram of the heart to evaluate her coronary artery anatomy and rule out the presence of significant proximal coronary artery disease.  If her CT angiogram is worrisome she might need diagnostic cardiac catheterization.  We will also obtain CT angiogram of the chest to rule out signs of subclinical pulmonary embolism related to her tricuspid valve mass.  Finally, the patient will undergo CT angiogram of the abdomen and pelvis to evaluate the feasibility of peripheral arterial cannulation for surgery.  The patient will return to our office for follow-up prior to  surgery on April 03, 2018.   I spent in excess of 90 minutes during the conduct of this office consultation and >50% of this time involved direct face-to-face encounter with the patient for counseling and/or coordination of their care.    Valentina Gu. Roxy Manns, MD 03/14/2018 3:56 PM

## 2018-03-14 NOTE — Patient Instructions (Signed)
Continue all previous medications without any changes at this time  

## 2018-03-15 ENCOUNTER — Encounter: Payer: Self-pay | Admitting: *Deleted

## 2018-03-15 ENCOUNTER — Other Ambulatory Visit: Payer: Self-pay | Admitting: *Deleted

## 2018-03-15 DIAGNOSIS — Z01818 Encounter for other preprocedural examination: Secondary | ICD-10-CM

## 2018-03-15 DIAGNOSIS — I078 Other rheumatic tricuspid valve diseases: Secondary | ICD-10-CM

## 2018-03-15 DIAGNOSIS — I7409 Other arterial embolism and thrombosis of abdominal aorta: Secondary | ICD-10-CM

## 2018-03-15 DIAGNOSIS — R079 Chest pain, unspecified: Secondary | ICD-10-CM

## 2018-03-15 DIAGNOSIS — R0602 Shortness of breath: Secondary | ICD-10-CM

## 2018-03-21 ENCOUNTER — Ambulatory Visit (HOSPITAL_COMMUNITY)
Admission: RE | Admit: 2018-03-21 | Discharge: 2018-03-21 | Disposition: A | Discharge status: Home / Self Care | Payer: 59 | Source: Ambulatory Visit | Attending: Thoracic Surgery (Cardiothoracic Vascular Surgery) | Admitting: Thoracic Surgery (Cardiothoracic Vascular Surgery)

## 2018-03-21 DIAGNOSIS — R079 Chest pain, unspecified: Secondary | ICD-10-CM

## 2018-03-21 DIAGNOSIS — R0602 Shortness of breath: Secondary | ICD-10-CM | POA: Insufficient documentation

## 2018-03-21 DIAGNOSIS — Z01818 Encounter for other preprocedural examination: Secondary | ICD-10-CM | POA: Diagnosis not present

## 2018-03-21 DIAGNOSIS — I078 Other rheumatic tricuspid valve diseases: Secondary | ICD-10-CM

## 2018-03-21 DIAGNOSIS — D259 Leiomyoma of uterus, unspecified: Secondary | ICD-10-CM | POA: Diagnosis not present

## 2018-03-21 DIAGNOSIS — I7409 Other arterial embolism and thrombosis of abdominal aorta: Secondary | ICD-10-CM | POA: Insufficient documentation

## 2018-03-21 MED ORDER — METOPROLOL TARTRATE 5 MG/5ML IV SOLN
INTRAVENOUS | Status: AC
  Administered 2018-03-21: 15 mg
  Filled 2018-03-21: qty 5

## 2018-03-21 MED ORDER — METOPROLOL TARTRATE 5 MG/5ML IV SOLN
INTRAVENOUS | Status: AC
  Filled 2018-03-21: qty 10

## 2018-03-21 MED ORDER — NITROGLYCERIN 0.4 MG SL SUBL
SUBLINGUAL_TABLET | SUBLINGUAL | Status: AC
  Administered 2018-03-21: 0.8 mg
  Filled 2018-03-21: qty 1

## 2018-03-21 MED ORDER — IOPAMIDOL (ISOVUE-370) INJECTION 76%
100.0000 mL | Freq: Once | INTRAVENOUS | Status: AC | PRN
  Administered 2018-03-21: 100 mL via INTRAVENOUS

## 2018-03-22 MED FILL — PANTOPRAZOLE SOD DR 40 MG T: 40 | 90 days supply | Qty: 180 | Fill #1

## 2018-03-22 MED FILL — JANUMET XR 100-1,000 MG TAB: 100-1000 | 30 days supply | Qty: 30 | Fill #9

## 2018-03-22 MED FILL — TRIAMTERENE/HCTZ 37.5/25 TB: 37.5-25 | 90 days supply | Qty: 90 | Fill #1

## 2018-03-30 ENCOUNTER — Inpatient Hospital Stay: Admission: RE | Admit: 2018-03-30 | Payer: 59 | Source: Ambulatory Visit

## 2018-03-30 ENCOUNTER — Other Ambulatory Visit: Payer: 59

## 2018-03-30 ENCOUNTER — Ambulatory Visit: Payer: 59 | Admitting: Interventional Cardiology

## 2018-03-30 ENCOUNTER — Ambulatory Visit
Admission: RE | Admit: 2018-03-30 | Discharge: 2018-03-30 | Disposition: A | Discharge status: Home / Self Care | Payer: 59 | Source: Ambulatory Visit | Attending: Thoracic Surgery (Cardiothoracic Vascular Surgery) | Admitting: Thoracic Surgery (Cardiothoracic Vascular Surgery)

## 2018-03-30 ENCOUNTER — Other Ambulatory Visit (HOSPITAL_COMMUNITY): Payer: 59

## 2018-03-30 DIAGNOSIS — Z01818 Encounter for other preprocedural examination: Secondary | ICD-10-CM

## 2018-03-30 DIAGNOSIS — R079 Chest pain, unspecified: Secondary | ICD-10-CM

## 2018-03-30 DIAGNOSIS — I7409 Other arterial embolism and thrombosis of abdominal aorta: Secondary | ICD-10-CM

## 2018-03-30 DIAGNOSIS — R0602 Shortness of breath: Secondary | ICD-10-CM

## 2018-03-30 MED ORDER — IOPAMIDOL (ISOVUE-370) INJECTION 76%
75.0000 mL | Freq: Once | INTRAVENOUS | Status: AC | PRN
  Administered 2018-03-30: 75 mL via INTRAVENOUS

## 2018-03-30 NOTE — Pre-Procedure Instructions (Signed)
Ariana Ramirez  03/30/2018      [place], [place] - [address] [place] [address] Phone: [phone] Fax: [phone]    Your procedure is scheduled on Friday October 18th.  Report to [facility] Admitting at [place].M.  Call this number if you have problems the morning of surgery:  [phone]   Remember:  Do not eat or drink after midnight.     Take these medicines the morning of surgery with A SIP OF WATER   Flonase (if needed)  Protonix  7 days prior to surgery STOP taking any Aspirin(unless otherwise instructed by your surgeon), Aleve, Naproxen, Ibuprofen, Motrin, Advil, Goody's, BC's, all herbal medications, fish oil, and all vitamins    WHAT DO I DO ABOUT MY DIABETES MEDICATION?   Marland Kitchen Do not take oral diabetes medicines (pills) the morning of surgery: Janumet  . The day of surgery, do not take other diabetes injectables, including Byetta (exenatide), Bydureon (exenatide ER), Victoza (liraglutide), or Trulicity (dulaglutide).  . If your CBG is greater than 220 mg/dL, you may take  of your sliding scale (correction) dose of insulin.   How to Manage Your Diabetes Before and After Surgery  Why is it important to control my blood sugar before and after surgery? . Improving blood sugar levels before and after surgery helps healing and can limit problems. . A way of improving blood sugar control is eating a healthy diet by: o  Eating less sugar and carbohydrates o  Increasing activity/exercise o  Talking with your doctor about reaching your blood sugar goals . High blood sugars (greater than 180 mg/dL) can raise your risk of infections and slow your recovery, so you will need to focus on controlling your diabetes during the weeks before surgery. . Make sure that the doctor who takes care of your diabetes knows about your planned surgery including the date and location.  How do I  manage my blood sugar before surgery? . Check your blood sugar at least 4 times a day, starting 2 days before surgery, to make sure that the level is not too high or low. o Check your blood sugar the morning of your surgery when you wake up and every 2 hours until you get to the Short Stay unit. . If your blood sugar is less than 70 mg/dL, you will need to treat for low blood sugar: o Do not take insulin. o Treat a low blood sugar (less than 70 mg/dL) with  cup of clear juice (cranberry or apple), 4 glucose tablets, OR glucose gel. o Recheck blood sugar in 15 minutes after treatment (to make sure it is greater than 70 mg/dL). If your blood sugar is not greater than 70 mg/dL on recheck, call [phone] for further instructions. . Report your blood sugar to the short stay nurse when you get to Short Stay.  . If you are admitted to the hospital after surgery: o Your blood sugar will be checked by the staff and you will probably be given insulin after surgery (instead of oral diabetes medicines) to make sure you have good blood sugar levels. o The goal for blood sugar control after surgery is 80-180 mg/dL.      Do not wear jewelry, make-up or nail polish.  Do not wear lotions, powders, or perfumes, or deodorant.  Do not shave 48 hours prior to surgery.  Men may shave face and neck.  Do not  bring valuables to the hospital.  [facility] is not responsible for any belongings or valuables.  Contacts, dentures or bridgework may not be worn into surgery.  Leave your suitcase in the car.  After surgery it may be brought to your room.  For patients admitted to the hospital, discharge time will be determined by your treatment team.  Patients discharged the day of surgery will not be allowed to drive home.    [place]- Preparing For Surgery  Before surgery, you can play an important role. Because skin is not sterile, your skin needs to be as free of germs as possible. You can reduce the number  of germs on your skin by washing with CHG (chlorahexidine gluconate) Soap before surgery.  CHG is an antiseptic cleaner which kills germs and bonds with the skin to continue killing germs even after washing.    Oral Hygiene is also important to reduce your risk of infection.  Remember - BRUSH YOUR TEETH THE MORNING OF SURGERY WITH YOUR REGULAR TOOTHPASTE  Please do not use if you have an allergy to CHG or antibacterial soaps. If your skin becomes reddened/irritated stop using the CHG.  Do not shave (including legs and underarms) for at least 48 hours prior to first CHG shower. It is OK to shave your face.  Please follow these instructions carefully.   1. Shower the NIGHT BEFORE SURGERY and the MORNING OF SURGERY with CHG.   2. If you chose to wash your hair, wash your hair first as usual with your normal shampoo.  3. After you shampoo, rinse your hair and body thoroughly to remove the shampoo.  4. Use CHG as you would any other liquid soap. You can apply CHG directly to the skin and wash gently with a scrungie or a clean washcloth.   5. Apply the CHG Soap to your body ONLY FROM THE NECK DOWN.  Do not use on open wounds or open sores. Avoid contact with your eyes, ears, mouth and genitals (private parts). Wash Face and genitals (private parts)  with your normal soap.  6. Wash thoroughly, paying special attention to the area where your surgery will be performed.  7. Thoroughly rinse your body with warm water from the neck down.  8. DO NOT shower/wash with your normal soap after using and rinsing off the CHG Soap.  9. Pat yourself dry with a CLEAN TOWEL.  10. Wear CLEAN PAJAMAS to bed the night before surgery, wear comfortable clothes the morning of surgery  11. Place CLEAN SHEETS on your bed the night of your first shower and DO NOT SLEEP WITH PETS.    Day of Surgery:  Do not apply any deodorants/lotions.  Please wear clean clothes to the hospital/surgery center.   Remember to  brush your teeth WITH YOUR REGULAR TOOTHPASTE.    Please read over the following fact sheets that you were given.

## 2018-04-03 ENCOUNTER — Ambulatory Visit (HOSPITAL_COMMUNITY)
Admission: RE | Admit: 2018-04-03 | Discharge: 2018-04-03 | Disposition: A | Discharge status: Home / Self Care | Payer: 59 | Source: Ambulatory Visit | Attending: Thoracic Surgery (Cardiothoracic Vascular Surgery) | Admitting: Thoracic Surgery (Cardiothoracic Vascular Surgery)

## 2018-04-03 ENCOUNTER — Other Ambulatory Visit: Payer: Self-pay

## 2018-04-03 ENCOUNTER — Encounter (HOSPITAL_COMMUNITY): Payer: Self-pay

## 2018-04-03 ENCOUNTER — Encounter (HOSPITAL_COMMUNITY)
Admission: RE | Admit: 2018-04-03 | Discharge: 2018-04-03 | Disposition: A | Discharge status: Home / Self Care | Payer: 59 | Source: Ambulatory Visit | Attending: Thoracic Surgery (Cardiothoracic Vascular Surgery) | Admitting: Thoracic Surgery (Cardiothoracic Vascular Surgery)

## 2018-04-03 ENCOUNTER — Ambulatory Visit (HOSPITAL_BASED_OUTPATIENT_CLINIC_OR_DEPARTMENT_OTHER)
Admission: RE | Admit: 2018-04-03 | Discharge: 2018-04-03 | Disposition: A | Discharge status: Home / Self Care | Payer: 59 | Source: Ambulatory Visit | Attending: Thoracic Surgery (Cardiothoracic Vascular Surgery) | Admitting: Thoracic Surgery (Cardiothoracic Vascular Surgery)

## 2018-04-03 ENCOUNTER — Ambulatory Visit: Payer: 59 | Admitting: Thoracic Surgery (Cardiothoracic Vascular Surgery)

## 2018-04-03 ENCOUNTER — Encounter: Payer: Self-pay | Admitting: Thoracic Surgery (Cardiothoracic Vascular Surgery)

## 2018-04-03 VITALS — BP 123/82 | HR 76 | Resp 20 | Ht 66.0 in | Wt 188.0 lb

## 2018-04-03 DIAGNOSIS — R942 Abnormal results of pulmonary function studies: Secondary | ICD-10-CM | POA: Diagnosis not present

## 2018-04-03 DIAGNOSIS — I078 Other rheumatic tricuspid valve diseases: Secondary | ICD-10-CM

## 2018-04-03 DIAGNOSIS — Z01818 Encounter for other preprocedural examination: Secondary | ICD-10-CM | POA: Diagnosis not present

## 2018-04-03 DIAGNOSIS — G5622 Lesion of ulnar nerve, left upper limb: Secondary | ICD-10-CM | POA: Insufficient documentation

## 2018-04-03 DIAGNOSIS — G5621 Lesion of ulnar nerve, right upper limb: Secondary | ICD-10-CM | POA: Insufficient documentation

## 2018-04-03 DIAGNOSIS — J90 Pleural effusion, not elsewhere classified: Secondary | ICD-10-CM | POA: Diagnosis not present

## 2018-04-03 HISTORY — DX: Gastro-esophageal reflux disease without esophagitis: K21.9

## 2018-04-03 LAB — PULMONARY FUNCTION TEST
DL/VA % PRED: 107 %
DL/VA: 5.43 ml/min/mmHg/L
DLCO unc % pred: 64 %
DLCO unc: 17.46 ml/min/mmHg
FEF 25-75 POST: 3.21 L/s
FEF 25-75 Pre: 3.04 L/sec
FEF2575-%CHANGE-POST: 5 %
FEF2575-%PRED-POST: 134 %
FEF2575-%PRED-PRE: 127 %
FEV1-%Change-Post: -1 %
FEV1-%Pred-Post: 81 %
FEV1-%Pred-Pre: 82 %
FEV1-PRE: 1.96 L
FEV1-Post: 1.94 L
FEV1FVC-%CHANGE-POST: 1 %
FEV1FVC-%Pred-Pre: 110 %
FEV6-%Change-Post: -2 %
FEV6-%Pred-Post: 74 %
FEV6-%Pred-Pre: 76 %
FEV6-Post: 2.18 L
FEV6-Pre: 2.23 L
FEV6FVC-%Pred-Post: 103 %
FEV6FVC-%Pred-Pre: 103 %
FVC-%CHANGE-POST: -2 %
FVC-%PRED-POST: 72 %
FVC-%Pred-Pre: 74 %
FVC-Post: 2.18 L
FVC-Pre: 2.23 L
POST FEV1/FVC RATIO: 89 %
PRE FEV1/FVC RATIO: 88 %
Post FEV6/FVC ratio: 100 %
Pre FEV6/FVC Ratio: 100 %
RV % pred: 60 %
RV: 1.22 L
TLC % pred: 66 %
TLC: 3.54 L

## 2018-04-03 LAB — PROTIME-INR
INR: 1.05
Prothrombin Time: 13.6 seconds (ref 11.4–15.2)

## 2018-04-03 LAB — URINALYSIS, ROUTINE W REFLEX MICROSCOPIC
BILIRUBIN URINE: NEGATIVE
Glucose, UA: NEGATIVE mg/dL
HGB URINE DIPSTICK: NEGATIVE
KETONES UR: NEGATIVE mg/dL
Leukocytes, UA: NEGATIVE
Nitrite: NEGATIVE
PH: 7 (ref 5.0–8.0)
Protein, ur: NEGATIVE mg/dL
Specific Gravity, Urine: 1.017 (ref 1.005–1.030)

## 2018-04-03 LAB — CBC
HCT: 36.8 % (ref 36.0–46.0)
HEMOGLOBIN: 11.6 g/dL — AB (ref 12.0–15.0)
MCH: 28.3 pg (ref 26.0–34.0)
MCHC: 31.5 g/dL (ref 30.0–36.0)
MCV: 89.8 fL (ref 80.0–100.0)
Platelets: 284 10*3/uL (ref 150–400)
RBC: 4.1 MIL/uL (ref 3.87–5.11)
RDW: 12.6 % (ref 11.5–15.5)
WBC: 6.5 10*3/uL (ref 4.0–10.5)
nRBC: 0 % (ref 0.0–0.2)

## 2018-04-03 LAB — COMPREHENSIVE METABOLIC PANEL
ALBUMIN: 4.2 g/dL (ref 3.5–5.0)
ALK PHOS: 85 U/L (ref 38–126)
ALT: 18 U/L (ref 0–44)
AST: 19 U/L (ref 15–41)
Anion gap: 11 (ref 5–15)
BUN: 17 mg/dL (ref 6–20)
CALCIUM: 9.4 mg/dL (ref 8.9–10.3)
CO2: 28 mmol/L (ref 22–32)
Chloride: 99 mmol/L (ref 98–111)
Creatinine, Ser: 0.96 mg/dL (ref 0.44–1.00)
GFR calc Af Amer: >60 mL/min (ref >60)
GFR calc non Af Amer: >60 mL/min (ref >60)
GLUCOSE: 114 mg/dL — AB (ref 70–99)
Potassium: 3.6 mmol/L (ref 3.5–5.1)
SODIUM: 138 mmol/L (ref 135–145)
Total Bilirubin: 0.6 mg/dL (ref 0.3–1.2)
Total Protein: 8.3 g/dL — ABNORMAL HIGH (ref 6.5–8.1)

## 2018-04-03 LAB — APTT: APTT: 29 s (ref 24–36)

## 2018-04-03 LAB — TYPE AND SCREEN
ABO/RH(D): AB POS
ANTIBODY SCREEN: NEGATIVE

## 2018-04-03 LAB — GLUCOSE, CAPILLARY: GLUCOSE-CAPILLARY: 89 mg/dL (ref 70–99)

## 2018-04-03 LAB — SURGICAL PCR SCREEN
MRSA, PCR: NEGATIVE
Staphylococcus aureus: NEGATIVE

## 2018-04-03 LAB — HEMOGLOBIN A1C
Hgb A1c MFr Bld: 6.9 % — ABNORMAL HIGH (ref 4.8–5.6)
Mean Plasma Glucose: 151.33 mg/dL

## 2018-04-03 LAB — ABO/RH: ABO/RH(D): AB POS

## 2018-04-03 MED ORDER — ALBUTEROL SULFATE (2.5 MG/3ML) 0.083% IN NEBU
2.5000 mg | INHALATION_SOLUTION | Freq: Once | RESPIRATORY_TRACT | Status: AC
  Administered 2018-04-03: 2.5 mg via RESPIRATORY_TRACT

## 2018-04-03 NOTE — Progress Notes (Signed)
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  She was originally seen in consultation on March 14, 2018.  Since then she underwent cardiac gated CT angiogram of the heart as well as CT angiogram of the chest, abdomen, and pelvis.  She returns to the office today to review the results of these tests with tentative plans to proceed with surgical resection of her tricuspid valve mass later this week.  She reports no new problems or complaints.   Current Outpatient Medications  Medication Sig Dispense Refill  . fluticasone (FLONASE) 50 MCG/ACT nasal spray Place 2 sprays into both nostrils daily as needed for allergies.    Marland Kitchen JANUMET XR [phone] MG TB24 Take 1 tablet by mouth daily.  3  . Multiple Vitamin (MULTIVITAMIN WITH MINERALS) TABS tablet Take 1 tablet by mouth every other day.     . pantoprazole (PROTONIX) 40 MG tablet Take 40 mg by mouth 2 (two) times daily.    . simvastatin (ZOCOR) 40 MG tablet Take 40 mg by mouth every evening.    . triamterene-hydrochlorothiazide (MAXZIDE-25) 37.5-25 MG per tablet Take 1 tablet by mouth daily.     No current facility-administered medications for this visit.       Physical Exam:   BP 123/82   Pulse 76   Resp 20   Ht 5\' 6"  (1.676 m)   Wt 188 lb (85.3 kg)   SpO2 99% Comment: RA  BMI 30.34 kg/m   General:  Well-appearing  Chest:   Clear to auscultation  CV:   Regular rate and rhythm without murmur  Incisions:  n/a  Abdomen:  Soft nontender  Extremities:  Warm and well-perfused  Diagnostic Tests:  Cardiac/Coronary  CT  TECHNIQUE: The patient was scanned on a [employer].  FINDINGS: A 120 kV prospective scan  was triggered in the descending thoracic aorta at 111 HU's. Axial non-contrast 3 mm slices were carried out through the heart. The data set was analyzed on a dedicated work station and scored using the [employer]. Gantry rotation speed was 250 msecs and collimation was .6 mm. No beta blockade and 0.8 mg of sl NTG was given. The 3D data set was reconstructed in 5% intervals of the 67-82 % of the R-R cycle. Diastolic phases were analyzed on a dedicated work station using MPR, MIP and VRT modes. The patient received 80 cc of contrast.  Aorta:  Normal size.  No calcifications.  No dissection.  Aortic Valve:  Trileaflet.  No calcifications.  Coronary Arteries:  Normal coronary origin.  Right dominance.  RCA is a large dominant artery that gives rise to PDA and a small PLA. There is no plaque.  Left main is a large artery that gives rise to LAD and LCX arteries. Left main has no plaque.  LAD is a large vessel that gives rise to one large diagonal artery and wraps around the apex, there is no plaque.  LCX is a non-dominant artery that gives rise to two OM branches. There is no plaque.  Other findings:  Normal pulmonary vein drainage into the left atrium.  Normal  let atrial appendage without a thrombus.  Normal size of the pulmonary artery.  IMPRESSION: 1. Coronary calcium score of 0. This was 0 percentile for age and sex matched control.  2. Normal coronary origin with right dominance.  3. No evidence of CAD.  4. There is a mass measuring 10.3 x 10 mm attached to the posterior leaflet of the tricuspid valve from the atrial side. 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   Electronically Signed   By: Ena Dawley   On: 03/21/2018 15:43   CT ANGIOGRAPHY ABDOMEN AND PELVIS WITH CONTRAST AND WITHOUT CONTRAST  TECHNIQUE: Multidetector CT imaging of the abdomen and pelvis was performed using the standard protocol during bolus  administration of intravenous contrast. Multiplanar reconstructed images and MIPs were obtained and reviewed to evaluate the vascular anatomy.  CONTRAST:  193mL ISOVUE-370 IOPAMIDOL (ISOVUE-370) INJECTION 76%  COMPARISON:  Concurrently obtained but separately dictated CT scan of the chest  FINDINGS: VASCULAR  Aorta: Normal caliber aorta without aneurysm, dissection, vasculitis or significant stenosis.  Celiac: Patent without evidence of aneurysm, dissection, vasculitis or significant stenosis.  SMA: Patent without evidence of aneurysm, dissection, vasculitis or significant stenosis.  Renals: Both renal arteries are patent without evidence of aneurysm, dissection, vasculitis, fibromuscular dysplasia or significant stenosis.  IMA: Patent without evidence of aneurysm, dissection, vasculitis or significant stenosis.  Inflow: Patent without evidence of aneurysm, dissection, vasculitis or significant stenosis.  Proximal Outflow: Bilateral common femoral and visualized portions of the superficial and profunda femoral arteries are patent without evidence of aneurysm, dissection, vasculitis or significant stenosis.  Veins: No obvious venous abnormality within the limitations of this arterial phase study.  Review of the MIP images confirms the above findings.  NON-VASCULAR  Lower chest: The lung bases are clear. Visualized cardiac structures are within normal limits for size. No pericardial effusion. Unremarkable visualized distal thoracic esophagus.  Hepatobiliary: Normal hepatic contour and morphology. No discrete hepatic lesions. Normal appearance of the gallbladder. No intra or extrahepatic biliary ductal dilatation.  Pancreas: Unremarkable. No pancreatic ductal dilatation or surrounding inflammatory changes.  Spleen: Normal in size without focal abnormality.  Adrenals/Urinary Tract: Normal adrenal glands. Symmetric kidneys without evidence of  hydronephrosis, nephrolithiasis or enhancing renal mass. Normal ureters and bladder.  Stomach/Bowel: Stomach is within normal limits. Appendix appears normal. No evidence of bowel wall thickening, distention, or inflammatory changes.  Lymphatic: No suspicious lymphadenopathy.  Reproductive: Left fundal uterine fibroid noted incidentally. No adnexal masses.  Other: No abdominal wall hernia or abnormality. No abdominopelvic ascites.  Musculoskeletal: No acute fracture or aggressive appearing lytic or blastic osseous lesion.  IMPRESSION: VASCULAR  1. No significant atherosclerotic vascular disease. Specifically, no evidence of aortoiliac stenosis or occlusive disease.  NON-VASCULAR  1. No acute abnormality within the abdomen or pelvis. 2. Uterine fibroid noted incidentally.  Signed,  Criselda Peaches, MD, [place]  [facility]  [facility]   Electronically Signed   By: Jacqulynn Cadet M.D.   On: 03/21/2018 12:03     CT ANGIOGRAPHY CHEST WITH CONTRAST  TECHNIQUE: Multidetector CT imaging of the chest was performed using the standard protocol during bolus administration of intravenous contrast. Multiplanar CT image reconstructions and MIPs were obtained to evaluate the vascular anatomy.  CONTRAST:  17mL ISOVUE-370 IOPAMIDOL (ISOVUE-370) INJECTION 76%  COMPARISON:  Coronary CTA 03/21/2018  FINDINGS: Cardiovascular: Satisfactory opacification of the pulmonary arteries to the segmental level. No evidence of pulmonary embolism. Normal heart size. No pericardial effusion. Normal caliber of the thoracic  aorta without atherosclerotic calcifications. Typical three-vessel arch anatomy. Bilateral subclavian arteries are patent. No evidence for an aortic dissection. Images of the abdominal aorta are unremarkable. Celiac trunk and proximal SMA are patent. Tricuspid valve lesion is not confidently  identified on this examination.  Mediastinum/Nodes: No mediastinal, hilar or axillary lymphadenopathy. Visualized thyroid tissue is unremarkable. Esophagus is unremarkable.  Lungs/Pleura: Lungs are clear without airspace disease or pulmonary edema. Few dependent densities in the lower lobes are most compatible with atelectasis. No pleural effusions.  Upper Abdomen: Images of the upper abdomen are unremarkable.  Musculoskeletal: No chest wall abnormality. No acute or significant osseous findings.  Review of the MIP images confirms the above findings.  IMPRESSION: 1. No acute chest abnormality. Specifically, no evidence for a pulmonary embolism. Normal appearance of the thoracic aorta without atherosclerotic disease. 2. Tricuspid valve mass is poorly characterized on this examination.   Electronically Signed   By: Markus Daft M.D.   On: 03/30/2018 11:51   Impression:  I have personally reviewed the patient's recent transthoracic and transesophageal echocardiograms and all CT angiograms.  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  Findings do not suggest the presence of blood clot or pulmonary embolus in transit.  Similarly, findings are not consistent with atrial myxoma nor possible malignancy.  There do not appear to be any other complicating features, and the mass was discovered serendipitously on recent transthoracic echocardiogram.  Patient remains entirely asymptomatic, and her recent complaints of a persistent dry nonproductive cough have apparently resolved following treatment with a short course of oral steroids.  Options include elective surgical resection versus continued observation.   Indications for surgery primarily stems from concerns that this highly mobile mass could embolize and potentially cause a significant pulmonary  embolus.  Cardiac gated CT angiogram of the heart reveals normal coronary artery anatomy with no significant coronary artery disease.  CT angiogram of the chest revealed no signs of previous pulmonary embolism.  CT angiogram of the aorta and iliac vessels revealed no contraindications to peripheral cannulation for surgery.    Plan:  I have again reviewed the indications, risks, and potential benefits of surgical resection of the patient's tricuspid valve mass and possible tricuspid valve repair with the patient and her husband in the office today. Alternative treatment strategies were discussed. They understand and accept all potential risks of surgery including but not limited to risk of death, stroke or other neurologic complication, myocardial infarction, congestive heart failure, respiratory failure, renal failure, bleeding requiring transfusion and/or reexploration, arrhythmia, infection or other wound complications, pneumonia, pleural and/or pericardial effusion, pulmonary embolus, aortic dissection or other major vascular complication, or delayed complications related to valve repair or replacement including but not limited to structural valve deterioration and failure, thrombosis, embolization, endocarditis, or paravalvular leak.  Alternative surgical approaches have been discussed including a comparison between conventional sternotomy and minimally-invasive techniques.  The relative risks and benefits of each have been reviewed as they pertain to the patient's specific circumstances, and all of their questions have been addressed.  Specific risks potentially related to the minimally-invasive approach were discussed at length, including but not limited to risk of conversion to full or partial sternotomy, aortic dissection or other major vascular complication, unilateral acute lung injury or pulmonary edema, phrenic nerve dysfunction or paralysis, rib fracture, chronic pain, lung hernia, or lymphocele.   All of their questions have been answered.  I spent in excess of 15 minutes during the conduct of this office consultation and >50% of this time involved direct face-to-face encounter with the patient for counseling and/or coordination of their care.   Valentina Gu. Roxy Manns, MD 04/03/2018 11:08 AM

## 2018-04-03 NOTE — Progress Notes (Signed)
Pt was difficult stick for PAT ABG. Lab requested it be pulled off her arterial line DOS.

## 2018-04-03 NOTE — Progress Notes (Signed)
Pre-op Cardiac Surgery  Carotid Findings:  Bilateral carotid Right - 1% to 39% ICA stenosis. Lt 40% to 59% Mid ICA stenosis by velocities. May be due to tortuosity,  Upper Extremity Right Left  Brachial Pressures 133 Triphasic 138 Triphasic  Radial Waveforms Triphasic Triphasic  Ulnar Waveforms Biphasic Biphasic  Palmar Arch (Allen's Test) Abnormal Normal   Findings:  Palmar arch evaluation - Right - Doppler waveforms diminished greater than 50% with radial compression and remained normal with ulnar compression. Left - Doppler waveforms reamined normal with both radial and ulnar compressions.   [place] Krystian Youn[place]ove,RVS 04/03/2018 5:35 PM

## 2018-04-03 NOTE — H&P (Signed)
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 dissection, vasculitis or significant stenosis.  Proximal Outflow: Bilateral common femoral and visualized portions of the superficial and profunda femoral arteries are patent without evidence of aneurysm, dissection, vasculitis or significant stenosis.  Veins: No obvious venous abnormality within the limitations of this arterial phase study.  Review of the MIP images confirms the above findings.  NON-VASCULAR  Lower chest: The lung bases are clear. Visualized cardiac  structures are within normal limits for size. No pericardial effusion. Unremarkable visualized distal thoracic esophagus.  Hepatobiliary: Normal hepatic contour and morphology. No discrete hepatic lesions. Normal appearance of the gallbladder. No intra or extrahepatic biliary ductal dilatation.  Pancreas: Unremarkable. No pancreatic ductal dilatation or surrounding inflammatory changes.  Spleen: Normal in size without focal abnormality.  Adrenals/Urinary Tract: Normal adrenal glands. Symmetric kidneys without evidence of hydronephrosis, nephrolithiasis or enhancing renal mass. Normal ureters and bladder.  Stomach/Bowel: Stomach is within normal limits. Appendix appears normal. No evidence of bowel wall thickening, distention, or inflammatory changes.  Lymphatic: No suspicious lymphadenopathy.  Reproductive: Left fundal uterine fibroid noted incidentally. No adnexal masses.  Other: No abdominal wall hernia or abnormality. No abdominopelvic ascites.  Musculoskeletal: No acute fracture or aggressive appearing lytic or blastic osseous lesion.  IMPRESSION: VASCULAR  1. No significant atherosclerotic vascular disease. Specifically, no evidence of aortoiliac stenosis or occlusive disease.  NON-VASCULAR  1. No acute abnormality within the abdomen or pelvis. 2. Uterine fibroid noted incidentally.  Signed,  Criselda Peaches, MD, [place]  [facility]  [facility]   Electronically Signed By: Jacqulynn Cadet M.D. On: 03/21/2018 12:03     CT ANGIOGRAPHY CHEST WITH CONTRAST  TECHNIQUE: Multidetector CT imaging of the chest was performed using the standard protocol during bolus administration of intravenous contrast. Multiplanar CT image reconstructions and MIPs were obtained to evaluate the vascular anatomy.  CONTRAST: 65mL ISOVUE-370 IOPAMIDOL (ISOVUE-370) INJECTION 76%  COMPARISON:  Coronary CTA 03/21/2018  FINDINGS: Cardiovascular: Satisfactory opacification of the pulmonary arteries to the segmental level. No evidence of pulmonary embolism. Normal heart size. No pericardial effusion. Normal caliber of the thoracic aorta without atherosclerotic calcifications. Typical three-vessel arch anatomy. Bilateral subclavian arteries are patent. No evidence for an aortic dissection. Images of the abdominal aorta are unremarkable. Celiac trunk and proximal SMA are patent. Tricuspid valve lesion is not confidently identified on this examination.  Mediastinum/Nodes: No mediastinal, hilar or axillary lymphadenopathy. Visualized thyroid tissue is unremarkable. Esophagus is unremarkable.  Lungs/Pleura: Lungs are clear without airspace disease or pulmonary edema. Few dependent densities in the lower lobes are most compatible with atelectasis. No pleural effusions.  Upper Abdomen: Images of the upper abdomen are unremarkable.  Musculoskeletal: No chest wall abnormality. No acute or significant osseous findings.  Review of the MIP images confirms the  above findings.  IMPRESSION: 1. No acute chest abnormality. Specifically, no evidence for a pulmonary embolism. Normal appearance of the thoracic aorta without atherosclerotic disease. 2. Tricuspid valve mass is poorly characterized on this examination.   Electronically Signed By: Markus Daft M.D. On: 03/30/2018 11:51   Impression:  I have personally reviewed the patient's recent transthoracic and transesophageal echocardiograms and all CT angiograms. 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 Findings do not suggest the presence of blood clot or pulmonary embolus intransit. Similarly, findings are not consistent with atrial myxoma nor possible malignancy. There do not  appear to be any other complicating features, and the mass was discovered serendipitously on recent transthoracic echocardiogram. Patient remains entirely asymptomatic, and her recent complaints of a persistent dry nonproductive cough have apparently resolved following treatment with a short course of oral steroids. Options include elective surgical resection versus continued observation.  Indications for surgery primarily stems from concerns that this highly mobile mass could embolize and potentially cause a significant pulmonary embolus.  Cardiac gated CT angiogram of the heart reveals normal coronary artery anatomy with no significant coronary artery disease.  CT angiogram of the chest revealed no signs of previous pulmonary embolism.  CT angiogram of the aorta and iliac vessels revealed no contraindications to peripheral cannulation for surgery.    Plan:  I have again reviewed the indications, risks, and potential benefits of surgical resection of the patient's tricuspid valve mass and possible tricuspid valve repair with the patient and her husband in the office today. Alternative treatment strategies were discussed. They understand and accept all potential risks of surgery including but not limited to risk of death, stroke or other neurologic complication, myocardial infarction, congestive heart failure, respiratory failure, renal failure, bleeding requiring transfusion and/or reexploration, arrhythmia, infection or other wound complications, pneumonia, pleural and/or pericardial effusion, pulmonary embolus, aortic dissection or other major vascular complication, or delayed complications related to valve repair or replacement including but not limited to structural valve deterioration and failure, thrombosis, embolization, endocarditis, or paravalvular leak.  Alternative surgical approaches have been discussed including a comparison between conventional sternotomy and minimally-invasive techniques.   The relative risks and benefits of each have been reviewed as they pertain to the patient's specific circumstances, and all of their questions have been addressed.  Specific risks potentially related to the minimally-invasive approach were discussed at length, including but not limited to risk of conversion to full or partial sternotomy, aortic dissection or other major vascular complication, unilateral acute lung injury or pulmonary edema, phrenic nerve dysfunction or paralysis, rib fracture, chronic pain, lung hernia, or lymphocele.  All of their questions have been answered.     Valentina Gu. Roxy Manns, MD 04/03/2018 11:08 AM

## 2018-04-03 NOTE — Patient Instructions (Signed)
   Continue taking all current medications without change through the day before surgery.  Have nothing to eat or drink after midnight the night before surgery.  On the morning of surgery take only Protonix with a sip of water.    

## 2018-04-03 NOTE — Progress Notes (Signed)
PCP - Dr. Domenick Gong MD Cardiologist -  Dr. Daneen Schick  Chest x-ray -  04/03/18 EKG - 04/03/18 ECHO - 02/2018  Fasting Blood Sugar -  90s-100s Checks Blood Sugar 1 times a day  Blood Thinner Instructions: N/a Aspirin Instructions: N/a  Anesthesia review: yes. Cardiac clearance testing.   Patient denies shortness of breath, fever, cough and chest pain at PAT appointment   Patient verbalized understanding of instructions that were given to them at the PAT appointment. Patient was also instructed that they will need to review over the PAT instructions again at home before surgery.

## 2018-04-04 ENCOUNTER — Other Ambulatory Visit: Payer: Self-pay | Admitting: *Deleted

## 2018-04-04 NOTE — Patient Outreach (Signed)
[facility]) Care Management  04/04/2018  Ariana Ramirez 1961-06-12 [id number]   Subjective: Telephone call to patient's home / mobile number, spoke with patient, and HIPAA verified.  Discussed [facility] Care Management UMR Transition of care follow up, preoperative call follow up, patient voiced understanding, and is in agreement to both types of follow up.  Patient states is doing okay, ready for surgery on 04/07/18 at [facility], and estimated length of stay is 6 -7 days.  Patient states she is able to manage self care and has assistance as needed post discharge.   Patient voices understanding of medical diagnosis, pending surgery, and treatment plan.  Discussed importance of hospital follow up with primary MD, patient voices understanding, and states he will follow up as appropriate.   States she is accessing the following [facility] benefits: outpatient pharmacy, hospital indemnity (not sure if chosen, verbally given contact number for [employer] [phone], will verify benefits, will file claim if appropriate), and has  family medical leave act (FMLA) in place.  Discussed Back Up Care Advantage Program resource for [facility] Employees, patient will receive information received from the benefit fair regarding this resource,  patient voices understanding, and states she will follow up as needed.   Discussed diabetes disease management resources for [facility] employees and advised RNCM will send web sites in the successful outreach letter.   States she has been active with [place] [facility] [employer] diabetes management  program, with pharmacist outreach in the past, and [place], but is not currently active with the program.  States she is very appreciative of the follow up and is in agreement to receive [place] Management information post transition of care follow up.      Objective: Per KPN (Knowledge Performance Now, point of care tool) and chart review, patient to be admitted 04/07/18  for MINIMALLY INVASIVE RESECTION OF TRICUSPID VALVE MASS, possible MINIMALLY INVASIVE TRICUSPID VALVE REPAIR (Right Chest) TRANSESOPHAGEAL ECHOCARDIOGRAM (TEE) at [facility].   Patient also has a history of diabetes, pulmonary hypertension, and hyperlipidemia.      Assessment: Received [facility]MR Preoperative  / Transition of care referral on 04/03/18.   Preoperative call completed, and transition of care follow up pending notification of patient discharge.     Plan: RNCM will call patient for  telephone outreach attempt, transition of care follow up, within 3 business days of hospital discharge notification.         Neeva Trew H. Annia Friendly, BSN, [place] Management [facility] Telephonic CM Phone: [phone] Fax: [phone]

## 2018-04-04 NOTE — Progress Notes (Signed)
Thanks for f/u ?

## 2018-04-06 ENCOUNTER — Encounter (HOSPITAL_COMMUNITY): Payer: Self-pay | Admitting: Certified Registered Nurse Anesthetist

## 2018-04-06 MED ORDER — PLASMA-LYTE 148 IV SOLN
INTRAVENOUS | Status: DC
  Filled 2018-04-06: qty 1

## 2018-04-06 MED ORDER — DEXMEDETOMIDINE HCL IN NACL 400 MCG/100ML IV SOLN
0.1000 ug/kg/h | INTRAVENOUS | Status: DC
  Filled 2018-04-06: qty 100

## 2018-04-06 MED ORDER — KENNESTONE BLOOD CARDIOPLEGIA (KBC) MANNITOL SYRINGE (20%, 32ML)
32.0000 mL | INTRAVENOUS | Status: DC
  Filled 2018-04-06: qty 1

## 2018-04-06 MED ORDER — SODIUM CHLORIDE 0.9 % IV SOLN
INTRAVENOUS | Status: DC
  Filled 2018-04-06 (×2): qty 30

## 2018-04-06 MED ORDER — PLASMA-LYTE 148 IV SOLN
INTRAVENOUS | Status: AC
  Administered 2018-04-07: 500 mL
  Filled 2018-04-06 (×2): qty 2.5

## 2018-04-06 MED ORDER — POTASSIUM CHLORIDE 2 MEQ/ML IV SOLN
80.0000 meq | INTRAVENOUS | Status: DC
  Filled 2018-04-06: qty 40

## 2018-04-06 MED ORDER — DOPAMINE-DEXTROSE 3.2-5 MG/ML-% IV SOLN
0.0000 ug/kg/min | INTRAVENOUS | Status: DC
  Filled 2018-04-06: qty 250

## 2018-04-06 MED ORDER — TRANEXAMIC ACID 1000 MG/10ML IV SOLN
1.5000 mg/kg/h | INTRAVENOUS | Status: DC
  Filled 2018-04-06: qty 25

## 2018-04-06 MED ORDER — TRANEXAMIC ACID (OHS) PUMP PRIME SOLUTION
2.0000 mg/kg | INTRAVENOUS | Status: DC
  Filled 2018-04-06: qty 1.7

## 2018-04-06 MED ORDER — KENNESTONE BLOOD CARDIOPLEGIA VIAL
13.0000 mL | Status: DC
  Filled 2018-04-06: qty 1

## 2018-04-06 MED ORDER — VANCOMYCIN HCL 1000 MG IV SOLR
INTRAVENOUS | Status: DC
  Filled 2018-04-06: qty 1000

## 2018-04-06 MED ORDER — NITROGLYCERIN IN D5W 200-5 MCG/ML-% IV SOLN
2.0000 ug/min | INTRAVENOUS | Status: DC
  Filled 2018-04-06: qty 250

## 2018-04-06 MED ORDER — MILRINONE LACTATE IN DEXTROSE 20-5 MG/100ML-% IV SOLN
0.3000 ug/kg/min | INTRAVENOUS | Status: DC
  Filled 2018-04-06: qty 100

## 2018-04-06 MED ORDER — PHENYLEPHRINE HCL-NACL 20-0.9 MG/250ML-% IV SOLN
30.0000 ug/min | INTRAVENOUS | Status: DC
  Filled 2018-04-06: qty 250

## 2018-04-06 MED ORDER — INSULIN REGULAR(HUMAN) IN NACL 100-0.9 UT/100ML-% IV SOLN
INTRAVENOUS | Status: DC
  Filled 2018-04-06: qty 100

## 2018-04-06 MED ORDER — SODIUM CHLORIDE 0.9 % IV SOLN
1.5000 g | INTRAVENOUS | Status: AC
  Administered 2018-04-07: 1.5 g via INTRAVENOUS
  Filled 2018-04-06: qty 1.5

## 2018-04-06 MED ORDER — TRANEXAMIC ACID (OHS) BOLUS VIA INFUSION
15.0000 mg/kg | INTRAVENOUS | Status: AC
  Administered 2018-04-07: 1276.5 mg via INTRAVENOUS
  Filled 2018-04-06: qty 1277

## 2018-04-06 MED ORDER — MAGNESIUM SULFATE 50 % IJ SOLN
40.0000 meq | INTRAMUSCULAR | Status: DC
  Filled 2018-04-06: qty 9.85

## 2018-04-06 MED ORDER — SODIUM CHLORIDE 0.9 % IV SOLN
750.0000 mg | INTRAVENOUS | Status: DC
  Filled 2018-04-06: qty 750

## 2018-04-06 MED ORDER — VANCOMYCIN HCL 10 G IV SOLR
1500.0000 mg | INTRAVENOUS | Status: AC
  Administered 2018-04-07: 1500 mg via INTRAVENOUS
  Filled 2018-04-06: qty 1500

## 2018-04-06 MED ORDER — EPINEPHRINE PF 1 MG/ML IJ SOLN
0.0000 ug/min | INTRAVENOUS | Status: DC
  Filled 2018-04-06: qty 4

## 2018-04-07 ENCOUNTER — Inpatient Hospital Stay (HOSPITAL_COMMUNITY): Payer: 59 | Admitting: Certified Registered Nurse Anesthetist

## 2018-04-07 ENCOUNTER — Inpatient Hospital Stay (HOSPITAL_COMMUNITY): Payer: 59

## 2018-04-07 ENCOUNTER — Ambulatory Visit (HOSPITAL_COMMUNITY): Payer: 59 | Attending: Thoracic Surgery (Cardiothoracic Vascular Surgery)

## 2018-04-07 ENCOUNTER — Inpatient Hospital Stay (HOSPITAL_COMMUNITY): Payer: 59 | Admitting: Physician Assistant

## 2018-04-07 ENCOUNTER — Encounter (HOSPITAL_COMMUNITY): Payer: Self-pay

## 2018-04-07 ENCOUNTER — Inpatient Hospital Stay (HOSPITAL_COMMUNITY)
Admission: RE | Admit: 2018-04-07 | Discharge: 2018-04-11 | DRG: 229 | Disposition: A | Discharge status: Home / Self Care | Payer: 59 | Source: Ambulatory Visit | Attending: Thoracic Surgery (Cardiothoracic Vascular Surgery) | Admitting: Thoracic Surgery (Cardiothoracic Vascular Surgery)

## 2018-04-07 ENCOUNTER — Encounter (HOSPITAL_COMMUNITY)
Admission: RE | Disposition: A | Discharge status: Home / Self Care | Payer: Self-pay | Source: Ambulatory Visit | Attending: Thoracic Surgery (Cardiothoracic Vascular Surgery)

## 2018-04-07 ENCOUNTER — Other Ambulatory Visit (HOSPITAL_COMMUNITY): Payer: 59

## 2018-04-07 ENCOUNTER — Other Ambulatory Visit: Payer: Self-pay

## 2018-04-07 DIAGNOSIS — Z87828 Personal history of other (healed) physical injury and trauma: Secondary | ICD-10-CM

## 2018-04-07 DIAGNOSIS — D151 Benign neoplasm of heart: Principal | ICD-10-CM | POA: Diagnosis present

## 2018-04-07 DIAGNOSIS — Z79899 Other long term (current) drug therapy: Secondary | ICD-10-CM

## 2018-04-07 DIAGNOSIS — Z6830 Body mass index (BMI) 30.0-30.9, adult: Secondary | ICD-10-CM

## 2018-04-07 DIAGNOSIS — E8770 Fluid overload, unspecified: Secondary | ICD-10-CM | POA: Diagnosis not present

## 2018-04-07 DIAGNOSIS — J90 Pleural effusion, not elsewhere classified: Secondary | ICD-10-CM | POA: Diagnosis not present

## 2018-04-07 DIAGNOSIS — R402363 Coma scale, best motor response, obeys commands, at hospital admission: Secondary | ICD-10-CM | POA: Diagnosis present

## 2018-04-07 DIAGNOSIS — Z8679 Personal history of other diseases of the circulatory system: Secondary | ICD-10-CM | POA: Diagnosis not present

## 2018-04-07 DIAGNOSIS — E119 Type 2 diabetes mellitus without complications: Secondary | ICD-10-CM | POA: Diagnosis present

## 2018-04-07 DIAGNOSIS — I272 Pulmonary hypertension, unspecified: Secondary | ICD-10-CM | POA: Diagnosis present

## 2018-04-07 DIAGNOSIS — R402133 Coma scale, eyes open, to sound, at hospital admission: Secondary | ICD-10-CM | POA: Diagnosis present

## 2018-04-07 DIAGNOSIS — D62 Acute posthemorrhagic anemia: Secondary | ICD-10-CM | POA: Diagnosis not present

## 2018-04-07 DIAGNOSIS — Z7984 Long term (current) use of oral hypoglycemic drugs: Secondary | ICD-10-CM

## 2018-04-07 DIAGNOSIS — J9811 Atelectasis: Secondary | ICD-10-CM | POA: Diagnosis not present

## 2018-04-07 DIAGNOSIS — R931 Abnormal findings on diagnostic imaging of heart and coronary circulation: Secondary | ICD-10-CM | POA: Insufficient documentation

## 2018-04-07 DIAGNOSIS — R402253 Coma scale, best verbal response, oriented, at hospital admission: Secondary | ICD-10-CM | POA: Diagnosis present

## 2018-04-07 DIAGNOSIS — E785 Hyperlipidemia, unspecified: Secondary | ICD-10-CM | POA: Diagnosis present

## 2018-04-07 DIAGNOSIS — I071 Rheumatic tricuspid insufficiency: Secondary | ICD-10-CM | POA: Diagnosis present

## 2018-04-07 DIAGNOSIS — Z886 Allergy status to analgesic agent status: Secondary | ICD-10-CM | POA: Diagnosis not present

## 2018-04-07 DIAGNOSIS — I078 Other rheumatic tricuspid valve diseases: Secondary | ICD-10-CM | POA: Insufficient documentation

## 2018-04-07 DIAGNOSIS — Z885 Allergy status to narcotic agent status: Secondary | ICD-10-CM | POA: Diagnosis not present

## 2018-04-07 DIAGNOSIS — Z91018 Allergy to other foods: Secondary | ICD-10-CM

## 2018-04-07 DIAGNOSIS — E669 Obesity, unspecified: Secondary | ICD-10-CM | POA: Diagnosis present

## 2018-04-07 DIAGNOSIS — Z8249 Family history of ischemic heart disease and other diseases of the circulatory system: Secondary | ICD-10-CM

## 2018-04-07 DIAGNOSIS — I081 Rheumatic disorders of both mitral and tricuspid valves: Secondary | ICD-10-CM | POA: Diagnosis not present

## 2018-04-07 DIAGNOSIS — K219 Gastro-esophageal reflux disease without esophagitis: Secondary | ICD-10-CM | POA: Diagnosis present

## 2018-04-07 DIAGNOSIS — R222 Localized swelling, mass and lump, trunk: Secondary | ICD-10-CM | POA: Diagnosis not present

## 2018-04-07 HISTORY — PX: MINIMALLY INVASIVE EXCISION OF ATRIAL MYXOMA: SHX5974

## 2018-04-07 HISTORY — PX: TEE WITHOUT CARDIOVERSION: SHX5443

## 2018-04-07 HISTORY — DX: Benign neoplasm of heart: D15.1

## 2018-04-07 LAB — BLOOD GAS, ARTERIAL

## 2018-04-07 LAB — POCT I-STAT 3, ART BLOOD GAS (G3+)
Acid-Base Excess: 1 mmol/L (ref 0.0–2.0)
Acid-base deficit: 1 mmol/L (ref 0.0–2.0)
BICARBONATE: 23.6 mmol/L (ref 20.0–28.0)
Bicarbonate: 26.1 mmol/L (ref 20.0–28.0)
O2 Saturation: 100 %
O2 Saturation: 99 %
PCO2 ART: 41.3 mmHg (ref 32.0–48.0)
PCO2 ART: 40 mmHg (ref 32.0–48.0)
PH ART: 7.408 (ref 7.350–7.450)
PO2 ART: 118 mmHg — AB (ref 83.0–108.0)
TCO2: 27 mmol/L (ref 22–32)
TCO2: 25 mmol/L (ref 22–32)
pH, Arterial: 7.379 (ref 7.350–7.450)
pO2, Arterial: 291 mmHg — ABNORMAL HIGH (ref 83.0–108.0)

## 2018-04-07 LAB — PROTIME-INR
INR: 1.53
PROTHROMBIN TIME: 18.2 s — AB (ref 11.4–15.2)

## 2018-04-07 LAB — POCT I-STAT, CHEM 8
BUN: 19 mg/dL (ref 6–20)
BUN: 19 mg/dL (ref 6–20)
BUN: 17 mg/dL (ref 6–20)
BUN: 17 mg/dL (ref 6–20)
BUN: 16 mg/dL (ref 6–20)
CALCIUM ION: 1.13 mmol/L — AB (ref 1.15–1.40)
CALCIUM ION: 1.12 mmol/L — AB (ref 1.15–1.40)
CALCIUM ION: 0.99 mmol/L — AB (ref 1.15–1.40)
CHLORIDE: 101 mmol/L (ref 98–111)
CHLORIDE: 98 mmol/L (ref 98–111)
CHLORIDE: 99 mmol/L (ref 98–111)
CHLORIDE: 99 mmol/L (ref 98–111)
Calcium, Ion: 0.94 mmol/L — ABNORMAL LOW (ref 1.15–1.40)
Calcium, Ion: 1 mmol/L — ABNORMAL LOW (ref 1.15–1.40)
Chloride: 103 mmol/L (ref 98–111)
Creatinine, Ser: 0.7 mg/dL (ref 0.44–1.00)
Creatinine, Ser: 0.7 mg/dL (ref 0.44–1.00)
Creatinine, Ser: 0.6 mg/dL (ref 0.44–1.00)
Creatinine, Ser: 0.6 mg/dL (ref 0.44–1.00)
Creatinine, Ser: 0.7 mg/dL (ref 0.44–1.00)
GLUCOSE: 260 mg/dL — AB (ref 70–99)
Glucose, Bld: 156 mg/dL — ABNORMAL HIGH (ref 70–99)
Glucose, Bld: 221 mg/dL — ABNORMAL HIGH (ref 70–99)
Glucose, Bld: 235 mg/dL — ABNORMAL HIGH (ref 70–99)
Glucose, Bld: 230 mg/dL — ABNORMAL HIGH (ref 70–99)
HCT: 30 % — ABNORMAL LOW (ref 36.0–46.0)
HCT: 20 % — ABNORMAL LOW (ref 36.0–46.0)
HEMATOCRIT: 24 % — AB (ref 36.0–46.0)
HEMATOCRIT: 21 % — AB (ref 36.0–46.0)
HEMATOCRIT: 21 % — AB (ref 36.0–46.0)
HEMOGLOBIN: 10.2 g/dL — AB (ref 12.0–15.0)
HEMOGLOBIN: 8.2 g/dL — AB (ref 12.0–15.0)
Hemoglobin: 6.8 g/dL — CL (ref 12.0–15.0)
Hemoglobin: 7.1 g/dL — ABNORMAL LOW (ref 12.0–15.0)
Hemoglobin: 7.1 g/dL — ABNORMAL LOW (ref 12.0–15.0)
POTASSIUM: 3 mmol/L — AB (ref 3.5–5.1)
POTASSIUM: 3.3 mmol/L — AB (ref 3.5–5.1)
POTASSIUM: 3 mmol/L — AB (ref 3.5–5.1)
Potassium: 3.6 mmol/L (ref 3.5–5.1)
Potassium: 3 mmol/L — ABNORMAL LOW (ref 3.5–5.1)
SODIUM: 139 mmol/L (ref 135–145)
SODIUM: 139 mmol/L (ref 135–145)
SODIUM: 139 mmol/L (ref 135–145)
SODIUM: 141 mmol/L (ref 135–145)
SODIUM: 137 mmol/L (ref 135–145)
TCO2: 29 mmol/L (ref 22–32)
TCO2: 26 mmol/L (ref 22–32)
TCO2: 27 mmol/L (ref 22–32)
TCO2: 31 mmol/L (ref 22–32)
TCO2: 24 mmol/L (ref 22–32)

## 2018-04-07 LAB — MAGNESIUM: Magnesium: 2.6 mg/dL — ABNORMAL HIGH (ref 1.7–2.4)

## 2018-04-07 LAB — GLUCOSE, CAPILLARY
GLUCOSE-CAPILLARY: 132 mg/dL — AB (ref 70–99)
GLUCOSE-CAPILLARY: 114 mg/dL — AB (ref 70–99)
GLUCOSE-CAPILLARY: 114 mg/dL — AB (ref 70–99)
GLUCOSE-CAPILLARY: 88 mg/dL (ref 70–99)
GLUCOSE-CAPILLARY: 146 mg/dL — AB (ref 70–99)
GLUCOSE-CAPILLARY: 122 mg/dL — AB (ref 70–99)
GLUCOSE-CAPILLARY: 91 mg/dL (ref 70–99)
GLUCOSE-CAPILLARY: 93 mg/dL (ref 70–99)
Glucose-Capillary: 96 mg/dL (ref 70–99)
Glucose-Capillary: 103 mg/dL — ABNORMAL HIGH (ref 70–99)

## 2018-04-07 LAB — HEMOGLOBIN AND HEMATOCRIT, BLOOD
HEMATOCRIT: 19 % — AB (ref 36.0–46.0)
HEMOGLOBIN: 6.2 g/dL — AB (ref 12.0–15.0)

## 2018-04-07 LAB — CBC
HCT: 33.5 % — ABNORMAL LOW (ref 36.0–46.0)
HCT: 30.3 % — ABNORMAL LOW (ref 36.0–46.0)
HEMOGLOBIN: 10.9 g/dL — AB (ref 12.0–15.0)
Hemoglobin: 9.9 g/dL — ABNORMAL LOW (ref 12.0–15.0)
MCH: 28.8 pg (ref 26.0–34.0)
MCH: 28.9 pg (ref 26.0–34.0)
MCHC: 32.5 g/dL (ref 30.0–36.0)
MCHC: 32.7 g/dL (ref 30.0–36.0)
MCV: 88.6 fL (ref 80.0–100.0)
MCV: 88.6 fL (ref 80.0–100.0)
NRBC: 0 % (ref 0.0–0.2)
Platelets: 141 10*3/uL — ABNORMAL LOW (ref 150–400)
Platelets: 137 10*3/uL — ABNORMAL LOW (ref 150–400)
RBC: 3.78 MIL/uL — AB (ref 3.87–5.11)
RBC: 3.42 MIL/uL — ABNORMAL LOW (ref 3.87–5.11)
RDW: 12.7 % (ref 11.5–15.5)
RDW: 12.7 % (ref 11.5–15.5)
WBC: 12.9 10*3/uL — ABNORMAL HIGH (ref 4.0–10.5)
WBC: 14.3 10*3/uL — ABNORMAL HIGH (ref 4.0–10.5)
nRBC: 0 % (ref 0.0–0.2)

## 2018-04-07 LAB — APTT: aPTT: 32 seconds (ref 24–36)

## 2018-04-07 LAB — CREATININE, SERUM: Creatinine, Ser: 0.78 mg/dL (ref 0.44–1.00)

## 2018-04-07 LAB — PLATELET COUNT: Platelets: 154 10*3/uL (ref 150–400)

## 2018-04-07 SURGERY — EXCISION, MYXOMA, CARDIAC ATRIUM, MINIMALLY INVASIVE
Anesthesia: General | Site: Chest

## 2018-04-07 MED ORDER — PHENYLEPHRINE 40 MCG/ML (10ML) SYRINGE FOR IV PUSH (FOR BLOOD PRESSURE SUPPORT)
PREFILLED_SYRINGE | INTRAVENOUS | Status: AC
  Filled 2018-04-07: qty 20

## 2018-04-07 MED ORDER — SODIUM CHLORIDE 0.9 % IV SOLN: 250.0000 mL | INTRAVENOUS | Status: DC

## 2018-04-07 MED ORDER — ACETAMINOPHEN 160 MG/5ML PO SOLN: 650.0000 mg | Freq: Once | ORAL | Status: DC

## 2018-04-07 MED ORDER — ACETAMINOPHEN 500 MG PO TABS
1000.0000 mg | ORAL_TABLET | Freq: Four times a day (QID) | ORAL | Status: DC
  Administered 2018-04-08 – 2018-04-11 (×11): 1000 mg via ORAL
  Filled 2018-04-07 (×11): qty 2

## 2018-04-07 MED ORDER — ROCURONIUM BROMIDE 50 MG/5ML IV SOSY
PREFILLED_SYRINGE | INTRAVENOUS | Status: AC
  Filled 2018-04-07: qty 15

## 2018-04-07 MED ORDER — SODIUM CHLORIDE 0.9 % IV SOLN
INTRAVENOUS | Status: DC | PRN
  Administered 2018-04-07: 1 [IU]/h via INTRAVENOUS

## 2018-04-07 MED ORDER — CHLORHEXIDINE GLUCONATE 0.12 % MT SOLN
15.0000 mL | OROMUCOSAL | Status: AC
  Administered 2018-04-07: 15 mL via OROMUCOSAL

## 2018-04-07 MED ORDER — ONDANSETRON HCL 4 MG/2ML IJ SOLN
INTRAMUSCULAR | Status: AC
  Filled 2018-04-07: qty 2

## 2018-04-07 MED ORDER — ALBUMIN HUMAN 5 % IV SOLN
250.0000 mL | INTRAVENOUS | Status: AC | PRN
  Administered 2018-04-07 (×2): 12.5 g via INTRAVENOUS

## 2018-04-07 MED ORDER — PROPOFOL 10 MG/ML IV BOLUS
INTRAVENOUS | Status: DC | PRN
  Administered 2018-04-07: 130 mg via INTRAVENOUS

## 2018-04-07 MED ORDER — CHLORHEXIDINE GLUCONATE CLOTH 2 % EX PADS
6.0000 | MEDICATED_PAD | Freq: Every day | CUTANEOUS | Status: DC
  Administered 2018-04-07 – 2018-04-09 (×3): 6 via TOPICAL

## 2018-04-07 MED ORDER — SODIUM CHLORIDE 0.9% FLUSH
10.0000 mL | Freq: Two times a day (BID) | INTRAVENOUS | Status: DC
  Administered 2018-04-07 – 2018-04-08 (×2): 10 mL

## 2018-04-07 MED ORDER — MIDAZOLAM HCL 10 MG/2ML IJ SOLN
INTRAMUSCULAR | Status: AC
  Filled 2018-04-07: qty 2

## 2018-04-07 MED ORDER — OXYCODONE HCL 5 MG PO TABS: 5.0000 mg | ORAL_TABLET | ORAL | Status: DC | PRN

## 2018-04-07 MED ORDER — MIDAZOLAM HCL 2 MG/2ML IJ SOLN: 2.0000 mg | INTRAMUSCULAR | Status: DC | PRN

## 2018-04-07 MED ORDER — SODIUM CHLORIDE 0.9% FLUSH: 10.0000 mL | INTRAVENOUS | Status: DC | PRN

## 2018-04-07 MED ORDER — FENTANYL CITRATE (PF) 250 MCG/5ML IJ SOLN
INTRAMUSCULAR | Status: AC
  Filled 2018-04-07: qty 25

## 2018-04-07 MED ORDER — CHLORHEXIDINE GLUCONATE 0.12 % MT SOLN
15.0000 mL | Freq: Once | OROMUCOSAL | Status: AC
  Administered 2018-04-07: 15 mL via OROMUCOSAL

## 2018-04-07 MED ORDER — ROCURONIUM BROMIDE 50 MG/5ML IV SOSY
PREFILLED_SYRINGE | INTRAVENOUS | Status: AC
  Filled 2018-04-07: qty 10

## 2018-04-07 MED ORDER — ROCURONIUM BROMIDE 50 MG/5ML IV SOSY
PREFILLED_SYRINGE | INTRAVENOUS | Status: AC
  Filled 2018-04-07: qty 5

## 2018-04-07 MED ORDER — BUPIVACAINE 0.5 % ON-Q PUMP SINGLE CATH 400 ML
400.0000 mL | INJECTION | Status: DC
  Filled 2018-04-07: qty 400

## 2018-04-07 MED ORDER — MAGNESIUM SULFATE 4 GM/100ML IV SOLN
4.0000 g | Freq: Once | INTRAVENOUS | Status: AC
  Administered 2018-04-07: 4 g via INTRAVENOUS
  Filled 2018-04-07: qty 100

## 2018-04-07 MED ORDER — SODIUM CHLORIDE 0.9 % IV SOLN
INTRAVENOUS | Status: DC | PRN
  Administered 2018-04-07: 1000 mL via INTRAMUSCULAR

## 2018-04-07 MED ORDER — LACTATED RINGERS IV SOLN
INTRAVENOUS | Status: DC | PRN
  Administered 2018-04-07 (×2): via INTRAVENOUS

## 2018-04-07 MED ORDER — TRAMADOL HCL 50 MG PO TABS
50.0000 mg | ORAL_TABLET | ORAL | Status: DC | PRN
  Administered 2018-04-08 – 2018-04-10 (×5): 50 mg via ORAL
  Filled 2018-04-07 (×6): qty 1

## 2018-04-07 MED ORDER — LACTATED RINGERS IV SOLN: INTRAVENOUS | Status: DC

## 2018-04-07 MED ORDER — DOCUSATE SODIUM 100 MG PO CAPS
200.0000 mg | ORAL_CAPSULE | Freq: Every day | ORAL | Status: DC
  Administered 2018-04-08 – 2018-04-11 (×4): 200 mg via ORAL
  Filled 2018-04-07 (×4): qty 2

## 2018-04-07 MED ORDER — ROCURONIUM BROMIDE 100 MG/10ML IV SOLN
INTRAVENOUS | Status: DC | PRN
  Administered 2018-04-07 (×2): 50 mg via INTRAVENOUS

## 2018-04-07 MED ORDER — MIDAZOLAM HCL 5 MG/5ML IJ SOLN
INTRAMUSCULAR | Status: DC | PRN
  Administered 2018-04-07 (×2): 2 mg via INTRAVENOUS
  Administered 2018-04-07: 1 mg via INTRAVENOUS

## 2018-04-07 MED ORDER — LACTATED RINGERS IV SOLN
INTRAVENOUS | Status: DC | PRN
  Administered 2018-04-07: 07:00:00 via INTRAVENOUS

## 2018-04-07 MED ORDER — CHLORHEXIDINE GLUCONATE 4 % EX LIQD: 30.0000 mL | CUTANEOUS | Status: DC

## 2018-04-07 MED ORDER — SODIUM CHLORIDE 0.9 % IV SOLN: INTRAVENOUS | Status: DC

## 2018-04-07 MED ORDER — MORPHINE SULFATE (PF) 2 MG/ML IV SOLN
1.0000 mg | INTRAVENOUS | Status: DC | PRN
  Administered 2018-04-07 – 2018-04-08 (×6): 2 mg via INTRAVENOUS
  Filled 2018-04-07 (×4): qty 1

## 2018-04-07 MED ORDER — ACETAMINOPHEN 160 MG/5ML PO SOLN: 1000.0000 mg | Freq: Four times a day (QID) | ORAL | Status: DC

## 2018-04-07 MED ORDER — CHLORHEXIDINE GLUCONATE 0.12 % MT SOLN
OROMUCOSAL | Status: AC
  Administered 2018-04-07: 15 mL via OROMUCOSAL
  Filled 2018-04-07: qty 15

## 2018-04-07 MED ORDER — ACETAMINOPHEN 650 MG RE SUPP: 650.0000 mg | Freq: Once | RECTAL | Status: DC

## 2018-04-07 MED ORDER — 0.9 % SODIUM CHLORIDE (POUR BTL) OPTIME
TOPICAL | Status: DC | PRN
  Administered 2018-04-07: 4000 mL

## 2018-04-07 MED ORDER — SODIUM CHLORIDE 0.45 % IV SOLN: INTRAVENOUS | Status: DC | PRN

## 2018-04-07 MED ORDER — HEPARIN SODIUM (PORCINE) 1000 UNIT/ML IJ SOLN
INTRAMUSCULAR | Status: AC
  Filled 2018-04-07: qty 1

## 2018-04-07 MED ORDER — METOPROLOL TARTRATE 12.5 MG HALF TABLET
ORAL_TABLET | ORAL | Status: AC
  Administered 2018-04-07: 12.5 mg via ORAL
  Filled 2018-04-07: qty 1

## 2018-04-07 MED ORDER — VANCOMYCIN HCL IN DEXTROSE 1-5 GM/200ML-% IV SOLN
1000.0000 mg | Freq: Once | INTRAVENOUS | Status: AC
  Administered 2018-04-07: 1000 mg via INTRAVENOUS
  Filled 2018-04-07: qty 200

## 2018-04-07 MED ORDER — EPHEDRINE 5 MG/ML INJ
INTRAVENOUS | Status: AC
  Filled 2018-04-07: qty 10

## 2018-04-07 MED ORDER — SODIUM CHLORIDE 0.9 % IV SOLN
INTRAVENOUS | Status: AC
  Administered 2018-04-07: 13:00:00 via INTRAVENOUS

## 2018-04-07 MED ORDER — POTASSIUM CHLORIDE 10 MEQ/50ML IV SOLN
10.0000 meq | INTRAVENOUS | Status: AC
  Administered 2018-04-07 (×3): 10 meq via INTRAVENOUS

## 2018-04-07 MED ORDER — PANTOPRAZOLE SODIUM 40 MG PO TBEC
40.0000 mg | DELAYED_RELEASE_TABLET | Freq: Two times a day (BID) | ORAL | Status: DC
  Administered 2018-04-08 – 2018-04-11 (×7): 40 mg via ORAL
  Filled 2018-04-07 (×7): qty 1

## 2018-04-07 MED ORDER — LACTATED RINGERS IV SOLN: 500.0000 mL | Freq: Once | INTRAVENOUS | Status: DC | PRN

## 2018-04-07 MED ORDER — PHENYLEPHRINE 40 MCG/ML (10ML) SYRINGE FOR IV PUSH (FOR BLOOD PRESSURE SUPPORT)
PREFILLED_SYRINGE | INTRAVENOUS | Status: AC
  Filled 2018-04-07: qty 10

## 2018-04-07 MED ORDER — LIDOCAINE 2% (20 MG/ML) 5 ML SYRINGE
INTRAMUSCULAR | Status: AC
  Filled 2018-04-07: qty 5

## 2018-04-07 MED ORDER — VANCOMYCIN HCL 1000 MG IV SOLR
INTRAVENOUS | Status: DC | PRN
  Administered 2018-04-07: 07:00:00

## 2018-04-07 MED ORDER — POTASSIUM CHLORIDE 10 MEQ/50ML IV SOLN
10.0000 meq | INTRAVENOUS | Status: AC
  Administered 2018-04-07 (×5): 10 meq via INTRAVENOUS
  Filled 2018-04-07: qty 50

## 2018-04-07 MED ORDER — PHENYLEPHRINE HCL-NACL 20-0.9 MG/250ML-% IV SOLN
0.0000 ug/min | INTRAVENOUS | Status: DC
  Filled 2018-04-07: qty 250

## 2018-04-07 MED ORDER — LIDOCAINE HCL (CARDIAC) PF 100 MG/5ML IV SOSY
PREFILLED_SYRINGE | INTRAVENOUS | Status: DC | PRN
  Administered 2018-04-07: 60 mg via INTRAVENOUS

## 2018-04-07 MED ORDER — INSULIN REGULAR BOLUS VIA INFUSION
0.0000 [IU] | Freq: Three times a day (TID) | INTRAVENOUS | Status: DC
  Filled 2018-04-07: qty 10

## 2018-04-07 MED ORDER — FENTANYL CITRATE (PF) 250 MCG/5ML IJ SOLN
INTRAMUSCULAR | Status: DC | PRN
  Administered 2018-04-07: 50 ug via INTRAVENOUS
  Administered 2018-04-07: 175 ug via INTRAVENOUS
  Administered 2018-04-07: 150 ug via INTRAVENOUS
  Administered 2018-04-07: 25 ug via INTRAVENOUS

## 2018-04-07 MED ORDER — METOPROLOL TARTRATE 12.5 MG HALF TABLET
12.5000 mg | ORAL_TABLET | Freq: Two times a day (BID) | ORAL | Status: DC
  Administered 2018-04-08 – 2018-04-11 (×6): 12.5 mg via ORAL
  Filled 2018-04-07 (×8): qty 1

## 2018-04-07 MED ORDER — SODIUM CHLORIDE 0.9 % IV SOLN
INTRAVENOUS | Status: DC | PRN
  Administered 2018-04-07: 23:00:00 via INTRAVENOUS

## 2018-04-07 MED ORDER — ONDANSETRON HCL 4 MG/2ML IJ SOLN
INTRAMUSCULAR | Status: DC | PRN
  Administered 2018-04-07: 4 mg via INTRAVENOUS

## 2018-04-07 MED ORDER — BISACODYL 5 MG PO TBEC
10.0000 mg | DELAYED_RELEASE_TABLET | Freq: Every day | ORAL | Status: DC
  Administered 2018-04-08 – 2018-04-11 (×4): 10 mg via ORAL
  Filled 2018-04-07 (×4): qty 2

## 2018-04-07 MED ORDER — METOPROLOL TARTRATE 25 MG/10 ML ORAL SUSPENSION: 12.5000 mg | Freq: Two times a day (BID) | ORAL | Status: DC

## 2018-04-07 MED ORDER — HEPARIN SODIUM (PORCINE) 1000 UNIT/ML IJ SOLN
INTRAMUSCULAR | Status: DC | PRN
  Administered 2018-04-07: 26000 [IU] via INTRAVENOUS

## 2018-04-07 MED ORDER — TRANEXAMIC ACID 1000 MG/10ML IV SOLN
INTRAVENOUS | Status: DC | PRN
  Administered 2018-04-07: 1.5 mg/kg/h via INTRAVENOUS

## 2018-04-07 MED ORDER — METOPROLOL TARTRATE 12.5 MG HALF TABLET
12.5000 mg | ORAL_TABLET | Freq: Once | ORAL | Status: AC
  Administered 2018-04-07: 12.5 mg via ORAL

## 2018-04-07 MED ORDER — BUPIVACAINE HCL 0.5 % IJ SOLN
INTRAMUSCULAR | Status: DC | PRN
  Administered 2018-04-07: 10 mL

## 2018-04-07 MED ORDER — NITROGLYCERIN IN D5W 200-5 MCG/ML-% IV SOLN
INTRAVENOUS | Status: DC | PRN
  Administered 2018-04-07: 16.6 ug/min via INTRAVENOUS

## 2018-04-07 MED ORDER — SODIUM CHLORIDE 0.9% FLUSH: 3.0000 mL | INTRAVENOUS | Status: DC | PRN

## 2018-04-07 MED ORDER — METOPROLOL TARTRATE 5 MG/5ML IV SOLN: 2.5000 mg | INTRAVENOUS | Status: DC | PRN

## 2018-04-07 MED ORDER — PHENYLEPHRINE HCL 10 MG/ML IJ SOLN
INTRAMUSCULAR | Status: DC | PRN
  Administered 2018-04-07 (×2): 80 ug via INTRAVENOUS
  Administered 2018-04-07: 40 ug via INTRAVENOUS

## 2018-04-07 MED ORDER — DEXMEDETOMIDINE HCL IN NACL 200 MCG/50ML IV SOLN
0.0000 ug/kg/h | INTRAVENOUS | Status: DC
  Filled 2018-04-07: qty 50

## 2018-04-07 MED ORDER — BUPIVACAINE HCL (PF) 0.5 % IJ SOLN
INTRAMUSCULAR | Status: AC
  Filled 2018-04-07: qty 10

## 2018-04-07 MED ORDER — PROPOFOL 10 MG/ML IV BOLUS
INTRAVENOUS | Status: AC
  Filled 2018-04-07: qty 20

## 2018-04-07 MED ORDER — FAMOTIDINE IN NACL 20-0.9 MG/50ML-% IV SOLN
20.0000 mg | Freq: Two times a day (BID) | INTRAVENOUS | Status: AC
  Administered 2018-04-07 (×2): 20 mg via INTRAVENOUS
  Filled 2018-04-07: qty 50

## 2018-04-07 MED ORDER — ONDANSETRON HCL 4 MG/2ML IJ SOLN: 4.0000 mg | Freq: Four times a day (QID) | INTRAMUSCULAR | Status: DC | PRN

## 2018-04-07 MED ORDER — MORPHINE SULFATE (PF) 2 MG/ML IV SOLN
1.0000 mg | INTRAVENOUS | Status: DC | PRN
  Filled 2018-04-07: qty 2

## 2018-04-07 MED ORDER — SODIUM CHLORIDE 0.9 % IV SOLN
INTRAVENOUS | Status: DC | PRN
  Administered 2018-04-07: 0.2 ug/kg/h via INTRAVENOUS

## 2018-04-07 MED ORDER — FLUMAZENIL 0.5 MG/5ML IV SOLN
INTRAVENOUS | Status: DC | PRN
  Administered 2018-04-07: .4 mg via INTRAVENOUS

## 2018-04-07 MED ORDER — EPHEDRINE SULFATE 50 MG/ML IJ SOLN
INTRAMUSCULAR | Status: DC | PRN
  Administered 2018-04-07: 5 mg via INTRAVENOUS
  Administered 2018-04-07: 10 mg via INTRAVENOUS

## 2018-04-07 MED ORDER — PROTAMINE SULFATE 10 MG/ML IV SOLN
INTRAVENOUS | Status: AC
  Filled 2018-04-07: qty 25

## 2018-04-07 MED ORDER — PROTAMINE SULFATE 10 MG/ML IV SOLN
INTRAVENOUS | Status: DC | PRN
  Administered 2018-04-07: 50 mg via INTRAVENOUS
  Administered 2018-04-07: 30 mg via INTRAVENOUS
  Administered 2018-04-07: 40 mg via INTRAVENOUS
  Administered 2018-04-07 (×2): 50 mg via INTRAVENOUS
  Administered 2018-04-07: 30 mg via INTRAVENOUS

## 2018-04-07 MED ORDER — SUGAMMADEX SODIUM 200 MG/2ML IV SOLN
INTRAVENOUS | Status: DC | PRN
  Administered 2018-04-07: 400 mg via INTRAVENOUS

## 2018-04-07 MED ORDER — SODIUM CHLORIDE 0.9 % IV SOLN
1.5000 g | Freq: Two times a day (BID) | INTRAVENOUS | Status: AC
  Administered 2018-04-07 – 2018-04-09 (×4): 1.5 g via INTRAVENOUS
  Filled 2018-04-07 (×4): qty 1.5

## 2018-04-07 MED ORDER — BISACODYL 10 MG RE SUPP: 10.0000 mg | Freq: Every day | RECTAL | Status: DC

## 2018-04-07 MED ORDER — NITROGLYCERIN IN D5W 200-5 MCG/ML-% IV SOLN: 0.0000 ug/min | INTRAVENOUS | Status: DC

## 2018-04-07 MED ORDER — SODIUM CHLORIDE 0.9% FLUSH: 3.0000 mL | Freq: Two times a day (BID) | INTRAVENOUS | Status: DC

## 2018-04-07 MED ORDER — INSULIN REGULAR(HUMAN) IN NACL 100-0.9 UT/100ML-% IV SOLN: INTRAVENOUS | Status: DC

## 2018-04-07 SURGICAL SUPPLY — 115 items
ADAPTER CARDIO PERF ANTE/RETRO (ADAPTER) ×8 IMPLANT
ADH SKN CLS APL DERMABOND .7 (GAUZE/BANDAGES/DRESSINGS) ×3
ADPR PRFSN 84XANTGRD RTRGD (ADAPTER) ×6
ATTRACTOMAT 16X20 MAGNETIC DRP (DRAPES) ×4 IMPLANT
BAG DECANTER FOR FLEXI CONT (MISCELLANEOUS) ×8 IMPLANT
BLADE SURG 11 STRL SS (BLADE) ×8 IMPLANT
CANISTER SUCT 3000ML PPV (MISCELLANEOUS) ×16 IMPLANT
CANNULA FEM VENOUS REMOTE 22FR (CANNULA) ×2 IMPLANT
CANNULA FEMORAL ART 14 SM (MISCELLANEOUS) ×8 IMPLANT
CANNULA GUNDRY RCSP 15FR (MISCELLANEOUS) ×8 IMPLANT
CANNULA OPTISITE PERFUSION 16F (CANNULA) IMPLANT
CANNULA OPTISITE PERFUSION 18F (CANNULA) ×2 IMPLANT
CANNULA SUMP PERICARDIAL (CANNULA) ×16 IMPLANT
CATH KIT ON Q 5IN SLV (PAIN MANAGEMENT) IMPLANT
CATH KIT ON-Q SILVERSOAK 5 (CATHETERS) IMPLANT
CATH KIT ON-Q SILVERSOAK 5IN (CATHETERS) ×4 IMPLANT
CATH ROBINSON RED A/P 18FR (CATHETERS) ×4 IMPLANT
CONN ST 1/4X3/8  BEN (MISCELLANEOUS) ×4
CONN ST 1/4X3/8 BEN (MISCELLANEOUS) ×12 IMPLANT
CONNECTOR 1/2X3/8X1/2 3 WAY (MISCELLANEOUS) ×2
CONNECTOR 1/2X3/8X1/2 3WAY (MISCELLANEOUS) ×6 IMPLANT
CONT SPEC 4OZ CLIKSEAL STRL BL (MISCELLANEOUS) ×8 IMPLANT
COVER BACK TABLE 24X17X13 BIG (DRAPES) ×8 IMPLANT
COVER PROBE W GEL 5X96 (DRAPES) ×4 IMPLANT
COVER WAND RF STERILE (DRAPES) ×8 IMPLANT
CRADLE DONUT ADULT HEAD (MISCELLANEOUS) ×8 IMPLANT
DERMABOND ADVANCED (GAUZE/BANDAGES/DRESSINGS) ×1
DERMABOND ADVANCED .7 DNX12 (GAUZE/BANDAGES/DRESSINGS) ×9 IMPLANT
DEVICE PMI PUNCTURE CLOSURE (MISCELLANEOUS) ×4 IMPLANT
DEVICE TROCAR PUNCTURE CLOSURE (ENDOMECHANICALS) ×8 IMPLANT
DRAIN CHANNEL 28F RND 3/8 FF (WOUND CARE) ×16 IMPLANT
DRAPE BILATERAL SPLIT (DRAPES) ×8 IMPLANT
DRAPE C-ARM 42X72 X-RAY (DRAPES) ×8 IMPLANT
DRAPE CV SPLIT W-CLR ANES SCRN (DRAPES) ×8 IMPLANT
DRAPE INCISE IOBAN 66X45 STRL (DRAPES) ×16 IMPLANT
DRAPE SLUSH/WARMER DISC (DRAPES) ×8 IMPLANT
DRSG COVADERM 4X8 (GAUZE/BANDAGES/DRESSINGS) ×8 IMPLANT
ELECT BLADE 6.5 EXT (BLADE) ×8 IMPLANT
ELECT REM PT RETURN 9FT ADLT (ELECTROSURGICAL) ×16
ELECTRODE REM PT RTRN 9FT ADLT (ELECTROSURGICAL) ×12 IMPLANT
FELT TEFLON 1X6 (MISCELLANEOUS) ×8 IMPLANT
FEMORAL VENOUS CANN RAP (CANNULA) IMPLANT
GAUZE SPONGE 4X4 12PLY STRL (GAUZE/BANDAGES/DRESSINGS) ×4 IMPLANT
GAUZE SPONGE 4X4 12PLY STRL LF (GAUZE/BANDAGES/DRESSINGS) ×4 IMPLANT
GLOVE ORTHO TXT STRL SZ7.5 (GLOVE) ×24 IMPLANT
GOWN STRL REUS W/ TWL LRG LVL3 (GOWN DISPOSABLE) ×24 IMPLANT
GOWN STRL REUS W/TWL LRG LVL3 (GOWN DISPOSABLE) ×32
KIT BASIN OR (CUSTOM PROCEDURE TRAY) ×8 IMPLANT
KIT DILATOR VASC 18G NDL (KITS) ×8 IMPLANT
KIT DRAINAGE VACCUM ASSIST (KITS) ×2 IMPLANT
KIT SUCTION CATH 14FR (SUCTIONS) ×8 IMPLANT
KIT TURNOVER KIT B (KITS) ×8 IMPLANT
LEAD PACING MYOCARDI (MISCELLANEOUS) ×8 IMPLANT
LINE VENT (MISCELLANEOUS) ×2 IMPLANT
NDL AORTIC ROOT 14G 7F (CATHETERS) ×4 IMPLANT
NEEDLE AORTIC ROOT 14G 7F (CATHETERS) ×8 IMPLANT
NS IRRIG 1000ML POUR BTL (IV SOLUTION) ×40 IMPLANT
PACK OPEN HEART (CUSTOM PROCEDURE TRAY) ×8 IMPLANT
PAD ARMBOARD 7.5X6 YLW CONV (MISCELLANEOUS) ×16 IMPLANT
PAD ELECT DEFIB RADIOL ZOLL (MISCELLANEOUS) ×8 IMPLANT
SET CANNULATION TOURNIQUET (MISCELLANEOUS) ×8 IMPLANT
SET CARDIOPLEGIA MPS 5001102 (MISCELLANEOUS) ×2 IMPLANT
SET IRRIG TUBING LAPAROSCOPIC (IRRIGATION / IRRIGATOR) ×8 IMPLANT
SOLUTION ANTI FOG 6CC (MISCELLANEOUS) ×8 IMPLANT
SUT BONE WAX W31G (SUTURE) ×8 IMPLANT
SUT E-PACK MINIMALLY INVASIVE (SUTURE) ×4 IMPLANT
SUT ETHIBOND (SUTURE) ×4 IMPLANT
SUT ETHIBOND 2 0 SH (SUTURE) ×2 IMPLANT
SUT ETHIBOND 2 0 V4 (SUTURE) IMPLANT
SUT ETHIBOND 2 0V4 GREEN (SUTURE) IMPLANT
SUT ETHIBOND 2-0 RB-1 WHT (SUTURE) ×4 IMPLANT
SUT ETHIBOND 4 0 TF (SUTURE) IMPLANT
SUT ETHIBOND 5 0 C 1 30 (SUTURE) IMPLANT
SUT ETHIBOND NAB MH 2-0 36IN (SUTURE) ×4 IMPLANT
SUT ETHIBOND X763 2 0 SH 1 (SUTURE) ×8 IMPLANT
SUT GORETEX 6.0 TH-9 30 IN (SUTURE) IMPLANT
SUT GORETEX CV 4 TH 22 36 (SUTURE) ×8 IMPLANT
SUT GORETEX CV-5THC-13 36IN (SUTURE) IMPLANT
SUT GORETEX CV4 TH-18 (SUTURE) ×16 IMPLANT
SUT GORETEX TH-18 36 INCH (SUTURE) IMPLANT
SUT MNCRL AB 3-0 PS2 18 (SUTURE) ×8 IMPLANT
SUT PROLENE 3 0 SH DA (SUTURE) ×16 IMPLANT
SUT PROLENE 3 0 SH1 36 (SUTURE) ×46 IMPLANT
SUT PROLENE 4 0 RB 1 (SUTURE) ×16
SUT PROLENE 4-0 RB1 .5 CRCL 36 (SUTURE) ×8 IMPLANT
SUT PROLENE 5 0 C 1 36 (SUTURE) ×8 IMPLANT
SUT PROLENE 6 0 C 1 30 (SUTURE) ×8 IMPLANT
SUT SILK  1 MH (SUTURE) ×7
SUT SILK 1 MH (SUTURE) ×21 IMPLANT
SUT SILK 1 TIES 10X30 (SUTURE) ×4 IMPLANT
SUT SILK 2 0 SH CR/8 (SUTURE) ×4 IMPLANT
SUT SILK 2 0 TIES 10X30 (SUTURE) ×4 IMPLANT
SUT SILK 2 0SH CR/8 30 (SUTURE) ×8 IMPLANT
SUT SILK 3 0 (SUTURE) ×4
SUT SILK 3 0 SH CR/8 (SUTURE) ×4 IMPLANT
SUT SILK 3 0SH CR/8 30 (SUTURE) ×4 IMPLANT
SUT SILK 3-0 18XBRD TIE 12 (SUTURE) ×3 IMPLANT
SUT TEM PAC WIRE 2 0 SH (SUTURE) ×8 IMPLANT
SUT VIC AB 2-0 CTX 36 (SUTURE) ×8 IMPLANT
SUT VIC AB 2-0 UR6 27 (SUTURE) ×8 IMPLANT
SUT VIC AB 3-0 SH 8-18 (SUTURE) ×12 IMPLANT
SUT VICRYL 2 TP 1 (SUTURE) ×4 IMPLANT
SYR 10ML LL (SYRINGE) ×8 IMPLANT
SYSTEM SAHARA CHEST DRAIN ATS (WOUND CARE) ×8 IMPLANT
TAPE CLOTH SURG 4X10 WHT LF (GAUZE/BANDAGES/DRESSINGS) ×2 IMPLANT
TOWEL GREEN STERILE (TOWEL DISPOSABLE) ×8 IMPLANT
TOWEL GREEN STERILE FF (TOWEL DISPOSABLE) ×8 IMPLANT
TRAY FOLEY SLVR 16FR TEMP STAT (SET/KITS/TRAYS/PACK) ×4 IMPLANT
TROCAR XCEL BLADELESS 5X75MML (TROCAR) ×8 IMPLANT
TROCAR XCEL NON-BLD 11X100MML (ENDOMECHANICALS) ×16 IMPLANT
TUBE SUCT INTRACARD DLP 20F (MISCELLANEOUS) ×8 IMPLANT
TUNNELER SHEATH ON-Q 11GX8 DSP (PAIN MANAGEMENT) ×4 IMPLANT
UNDERPAD 30X30 (UNDERPADS AND DIAPERS) ×8 IMPLANT
WATER STERILE IRR 1000ML POUR (IV SOLUTION) ×16 IMPLANT
WIRE .035 3MM-J 145CM (WIRE) ×4 IMPLANT

## 2018-04-07 NOTE — Progress Notes (Signed)
Patient interviewed in the preop area. Patient able to confirm name, DOB, procedure, allergies, no metal in body, npo status and no pain at this time. Family and CRNA at bedside.   Leatha Gilding, RN

## 2018-04-07 NOTE — Interval H&P Note (Signed)
History and Physical Interval Note:  04/07/2018 6:23 AM  Ariana Ramirez  has presented today for surgery, with the diagnosis of TV MASS  The various methods of treatment have been discussed with the patient and family. After consideration of risks, benefits and other options for treatment, the patient has consented to  Procedure(s): MINIMALLY INVASIVE RESECTION OF TRICUSPID VALVE MASS (N/A) possible MINIMALLY INVASIVE TRICUSPID VALVE REPAIR (Right) TRANSESOPHAGEAL ECHOCARDIOGRAM (TEE) (N/A) as a surgical intervention .  The patient's history has been reviewed, patient examined, no change in status, stable for surgery.  I have reviewed the patient's chart and labs.  Questions were answered to the patient's satisfaction.     Rexene Alberts

## 2018-04-07 NOTE — Anesthesia Procedure Notes (Signed)
Arterial Line Insertion Start/End10/18/2019 7:15 AM, 04/07/2018 7:25 AM Performed by: Winifred Balogh T, Immunologist, CRNA  Patient location: Pre-op. Preanesthetic checklist: patient identified, IV checked, site marked, risks and benefits discussed, surgical consent, monitors and equipment checked and pre-op evaluation Lidocaine 1% used for infiltration and patient sedated Left, radial was placed Catheter size: 20 G Hand hygiene performed  and maximum sterile barriers used   Attempts: 2 Procedure performed without using ultrasound guided technique. Following insertion, dressing applied and Biopatch. Post procedure assessment: normal  Patient tolerated the procedure well with no immediate complications.

## 2018-04-07 NOTE — Anesthesia Preprocedure Evaluation (Addendum)
Anesthesia Evaluation  Patient identified by MRN, date of birth, ID band Patient awake    Reviewed: Allergy & Precautions, NPO status , Patient's Chart, lab work & pertinent test results  Airway Mallampati: II  TM Distance: >3 FB Neck ROM: Full    Dental  (+) Teeth Intact, Dental Advisory Given   Pulmonary    breath sounds clear to auscultation       Cardiovascular  Rhythm:Regular Rate:Normal     Neuro/Psych    GI/Hepatic   Endo/Other  diabetes  Renal/GU      Musculoskeletal   Abdominal   Peds  Hematology   Anesthesia Other Findings   Reproductive/Obstetrics                             Anesthesia Physical Anesthesia Plan  ASA: III  Anesthesia Plan: General   Post-op Pain Management:    Induction: Intravenous  PONV Risk Score and Plan: 1 and Ondansetron and Dexamethasone  Airway Management Planned: Double Lumen EBT  Additional Equipment: Arterial line and CVP  Intra-op Plan:   Post-operative Plan: Possible Post-op intubation/ventilation  Informed Consent: I have reviewed the patients History and Physical, chart, labs and discussed the procedure including the risks, benefits and alternatives for the proposed anesthesia with the patient or authorized representative who has indicated his/her understanding and acceptance.   Dental advisory given  Plan Discussed with: CRNA and Anesthesiologist  Anesthesia Plan Comments:         Anesthesia Quick Evaluation

## 2018-04-07 NOTE — Anesthesia Procedure Notes (Signed)
Central Venous Catheter Insertion Performed by: Roberts Gaudy, MD, anesthesiologist Start/End10/18/2019 6:50 AM, 04/07/2018 7:00 AM Patient location: Pre-op. Preanesthetic checklist: patient identified, IV checked, site marked, risks and benefits discussed, surgical consent, monitors and equipment checked, pre-op evaluation, timeout performed and anesthesia consent Position: Trendelenburg Lidocaine 1% used for infiltration and patient sedated Hand hygiene performed , maximum sterile barriers used  and Seldinger technique used Catheter size: 8 Fr Total catheter length 16. Central line was placed.Double lumen Procedure performed using ultrasound guided technique. Ultrasound Notes:anatomy identified, needle tip was noted to be adjacent to the nerve/plexus identified, no ultrasound evidence of intravascular and/or intraneural injection and image(s) printed for medical record Attempts: 1 Following insertion, dressing applied, line sutured and Biopatch. Post procedure assessment: blood return through all ports  Patient tolerated the procedure well with no immediate complications.

## 2018-04-07 NOTE — Progress Notes (Addendum)
  Echocardiogram Echocardiogram Transesophageal has been performed.  Ariana Ramirez 04/07/2018, 8:38 AM

## 2018-04-07 NOTE — Anesthesia Procedure Notes (Signed)
Procedure Name: Intubation Date/Time: 04/07/2018 8:09 AM Performed by: Waddell Iten T, CRNA Pre-anesthesia Checklist: Patient identified, Emergency Drugs available, Suction available and Patient being monitored Patient Re-evaluated:Patient Re-evaluated prior to induction Oxygen Delivery Method: Circle system utilized Preoxygenation: Pre-oxygenation with 100% oxygen Induction Type: IV induction Ventilation: Mask ventilation without difficulty and Oral airway inserted - appropriate to patient size Laryngoscope Size: Miller and 2 Grade View: Grade I Endobronchial tube: Left, Double lumen EBT, EBT position confirmed by auscultation and EBT position confirmed by fiberoptic bronchoscope and 37 Fr Number of attempts: 1 Airway Equipment and Method: Patient positioned with wedge pillow and Stylet Placement Confirmation: ETT inserted through vocal cords under direct vision,  positive ETCO2 and breath sounds checked- equal and bilateral Secured at: 27 cm Tube secured with: Tape Dental Injury: Teeth and Oropharynx as per pre-operative assessment
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
## 2018-04-07 NOTE — Transfer of Care (Signed)
Immediate Anesthesia Transfer of Care Note  Patient: Ariana Ramirez  Procedure(s) Performed: MINIMALLY INVASIVE RESECTION OF TRICUSPID VALVE MASS (N/A Chest) TRANSESOPHAGEAL ECHOCARDIOGRAM (TEE) (N/A )  Patient Location: ICU  Anesthesia Type:General  Level of Consciousness: drowsy and patient cooperative  Airway & Oxygen Therapy: Patient Spontanous Breathing and Patient connected to face mask oxygen  Post-op Assessment: Report given to RN, Post -op Vital signs reviewed and stable and Patient moving all extremities  Post vital signs: Reviewed and stable  Last Vitals:  Vitals Value Taken Time  BP    Temp    Pulse 80 04/07/2018 12:25 PM  Resp 19 04/07/2018 12:25 PM  SpO2 100 % 04/07/2018 12:25 PM  Vitals shown include unvalidated device data.  Last Pain:  Vitals:   04/07/18 0605  TempSrc:   PainSc: 0-No pain      Patients Stated Pain Goal: 3 (31/49/70 2637)  Complications: No apparent anesthesia complications

## 2018-04-07 NOTE — Brief Op Note (Signed)
04/07/2018  10:18 AM  PATIENT:  Ariana Ramirez  57 y.o. female  PRE-OPERATIVE DIAGNOSIS:  TV MASS  POST-OPERATIVE DIAGNOSIS:  * No post-op diagnosis entered *  PROCEDURE:  Procedure(s):  MINIMALLY INVASIVE RESECTION OF TRICUSPID VALVE MASS (N/A) TRANSESOPHAGEAL ECHOCARDIOGRAM (TEE) (N/A)  SURGEON:  Surgeon(s) and Role:    Rexene Alberts, MD - Primary  PHYSICIAN ASSISTANT: Ellwood Handler PA-C  ANESTHESIA:   general  EBL:  350   BLOOD ADMINISTERED: CELLSAVER  DRAINS: Chest Tubes Right Chest   LOCAL MEDICATIONS USED:  BUPIVICAINE   SPECIMEN:  Source of Specimen:  Mass off Tricuspid Valve  DISPOSITION OF SPECIMEN:  PATHOLOGY  COUNTS:  YES  TOURNIQUET:  * No tourniquets in log *  DICTATION: .Dragon Dictation  PLAN OF CARE: Admit to inpatient   PATIENT DISPOSITION:  ICU - intubated and hemodynamically stable.   Delay start of Pharmacological VTE agent (>24hrs) due to surgical blood loss or risk of bleeding: yes

## 2018-04-07 NOTE — Progress Notes (Signed)
[facility] BRIEF SICU PROGRESS NOTE  Day of Surgery  S/P Procedure(s) (LRB): MINIMALLY INVASIVE RESECTION OF TRICUSPID VALVE MASS (N/A) TRANSESOPHAGEAL ECHOCARDIOGRAM (TEE) (N/A)   Up in chair Mild soreness in chest Breathing comfortably on 2 L/min NSR w/ stable BP Chest tube output low UOP excellent Labs okay  Plan: Continue routine early postop  Rexene Alberts, MD 04/07/2018 5:28 PM
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
## 2018-04-07 NOTE — Anesthesia Postprocedure Evaluation (Signed)
Anesthesia Post Note  Patient: Ariana Ramirez  Procedure(s) Performed: MINIMALLY INVASIVE RESECTION OF TRICUSPID VALVE MASS (N/A Chest) TRANSESOPHAGEAL ECHOCARDIOGRAM (TEE) (N/A )     Patient location during evaluation: SICU Anesthesia Type: General Level of consciousness: awake, awake and alert and oriented Pain management: pain level controlled Vital Signs Assessment: post-procedure vital signs reviewed and stable Respiratory status: spontaneous breathing, nonlabored ventilation and patient connected to nasal cannula oxygen Cardiovascular status: blood pressure returned to baseline Anesthetic complications: no    Last Vitals:  Vitals:   04/07/18 2000 04/07/18 2100  BP: 98/66 (!) 96/50  Pulse: 80 (!) 42  Resp: (!) 9   Temp: 36.5 C   SpO2: 99% 98%    Last Pain:  Vitals:   04/07/18 2011  TempSrc:   PainSc: 8                  Tagen Brethauer COKER

## 2018-04-08 ENCOUNTER — Inpatient Hospital Stay (HOSPITAL_COMMUNITY): Payer: 59

## 2018-04-08 LAB — GLUCOSE, CAPILLARY
GLUCOSE-CAPILLARY: 104 mg/dL — AB (ref 70–99)
GLUCOSE-CAPILLARY: 113 mg/dL — AB (ref 70–99)
GLUCOSE-CAPILLARY: 114 mg/dL — AB (ref 70–99)
GLUCOSE-CAPILLARY: 131 mg/dL — AB (ref 70–99)
GLUCOSE-CAPILLARY: 148 mg/dL — AB (ref 70–99)
GLUCOSE-CAPILLARY: 91 mg/dL (ref 70–99)
Glucose-Capillary: 102 mg/dL — ABNORMAL HIGH (ref 70–99)
Glucose-Capillary: 139 mg/dL — ABNORMAL HIGH (ref 70–99)
Glucose-Capillary: 116 mg/dL — ABNORMAL HIGH (ref 70–99)
Glucose-Capillary: 211 mg/dL — ABNORMAL HIGH (ref 70–99)
Glucose-Capillary: 173 mg/dL — ABNORMAL HIGH (ref 70–99)
Glucose-Capillary: 82 mg/dL (ref 70–99)

## 2018-04-08 LAB — POCT I-STAT, CHEM 8
BUN: 13 mg/dL (ref 6–20)
BUN: 14 mg/dL (ref 6–20)
BUN: 16 mg/dL (ref 6–20)
CALCIUM ION: 1.1 mmol/L — AB (ref 1.15–1.40)
CALCIUM ION: 1.12 mmol/L — AB (ref 1.15–1.40)
CHLORIDE: 103 mmol/L (ref 98–111)
CHLORIDE: 105 mmol/L (ref 98–111)
CREATININE: 0.7 mg/dL (ref 0.44–1.00)
CREATININE: 0.9 mg/dL (ref 0.44–1.00)
Calcium, Ion: 1.08 mmol/L — ABNORMAL LOW (ref 1.15–1.40)
Chloride: 101 mmol/L (ref 98–111)
Creatinine, Ser: 0.8 mg/dL (ref 0.44–1.00)
GLUCOSE: 102 mg/dL — AB (ref 70–99)
GLUCOSE: 108 mg/dL — AB (ref 70–99)
GLUCOSE: 99 mg/dL (ref 70–99)
HCT: 25 % — ABNORMAL LOW (ref 36.0–46.0)
HCT: 26 % — ABNORMAL LOW (ref 36.0–46.0)
HEMATOCRIT: 30 % — AB (ref 36.0–46.0)
HEMOGLOBIN: 10.2 g/dL — AB (ref 12.0–15.0)
HEMOGLOBIN: 8.8 g/dL — AB (ref 12.0–15.0)
Hemoglobin: 8.5 g/dL — ABNORMAL LOW (ref 12.0–15.0)
POTASSIUM: 3.4 mmol/L — AB (ref 3.5–5.1)
POTASSIUM: 4 mmol/L (ref 3.5–5.1)
Potassium: 3.4 mmol/L — ABNORMAL LOW (ref 3.5–5.1)
Sodium: 140 mmol/L (ref 135–145)
Sodium: 140 mmol/L (ref 135–145)
Sodium: 142 mmol/L (ref 135–145)
TCO2: 24 mmol/L (ref 22–32)
TCO2: 25 mmol/L (ref 22–32)
TCO2: 27 mmol/L (ref 22–32)

## 2018-04-08 LAB — CBC
HCT: 28.8 % — ABNORMAL LOW (ref 36.0–46.0)
HCT: 28.9 % — ABNORMAL LOW (ref 36.0–46.0)
Hemoglobin: 8.9 g/dL — ABNORMAL LOW (ref 12.0–15.0)
Hemoglobin: 9.3 g/dL — ABNORMAL LOW (ref 12.0–15.0)
MCH: 27.7 pg (ref 26.0–34.0)
MCH: 29 pg (ref 26.0–34.0)
MCHC: 30.9 g/dL (ref 30.0–36.0)
MCHC: 32.2 g/dL (ref 30.0–36.0)
MCV: 89.7 fL (ref 80.0–100.0)
MCV: 90 fL (ref 80.0–100.0)
NRBC: 0 % (ref 0.0–0.2)
PLATELETS: 126 10*3/uL — AB (ref 150–400)
PLATELETS: 118 10*3/uL — AB (ref 150–400)
RBC: 3.21 MIL/uL — ABNORMAL LOW (ref 3.87–5.11)
RBC: 3.21 MIL/uL — AB (ref 3.87–5.11)
RDW: 13 % (ref 11.5–15.5)
RDW: 13.2 % (ref 11.5–15.5)
WBC: 12.1 10*3/uL — ABNORMAL HIGH (ref 4.0–10.5)
WBC: 10.5 10*3/uL (ref 4.0–10.5)
nRBC: 0 % (ref 0.0–0.2)

## 2018-04-08 LAB — POCT I-STAT 4, (NA,K, GLUC, HGB,HCT)
Glucose, Bld: 191 mg/dL — ABNORMAL HIGH (ref 70–99)
HEMATOCRIT: 33 % — AB (ref 36.0–46.0)
Hemoglobin: 11.2 g/dL — ABNORMAL LOW (ref 12.0–15.0)
Potassium: 3.2 mmol/L — ABNORMAL LOW (ref 3.5–5.1)
SODIUM: 141 mmol/L (ref 135–145)

## 2018-04-08 LAB — BASIC METABOLIC PANEL
Anion gap: 7 (ref 5–15)
BUN: 12 mg/dL (ref 6–20)
CO2: 23 mmol/L (ref 22–32)
CREATININE: 0.83 mg/dL (ref 0.44–1.00)
Calcium: 7.6 mg/dL — ABNORMAL LOW (ref 8.9–10.3)
Chloride: 107 mmol/L (ref 98–111)
GFR calc Af Amer: >60 mL/min (ref >60)
Glucose, Bld: 120 mg/dL — ABNORMAL HIGH (ref 70–99)
POTASSIUM: 3.6 mmol/L (ref 3.5–5.1)
SODIUM: 137 mmol/L (ref 135–145)

## 2018-04-08 LAB — POCT I-STAT 3, ART BLOOD GAS (G3+)
Acid-Base Excess: 1 mmol/L (ref 0.0–2.0)
Bicarbonate: 28.2 mmol/L — ABNORMAL HIGH (ref 20.0–28.0)
O2 SAT: 99 %
TCO2: 30 mmol/L (ref 22–32)
pCO2 arterial: 54.1 mmHg — ABNORMAL HIGH (ref 32.0–48.0)
pH, Arterial: 7.325 — ABNORMAL LOW (ref 7.350–7.450)
pO2, Arterial: 139 mmHg — ABNORMAL HIGH (ref 83.0–108.0)

## 2018-04-08 LAB — MAGNESIUM
MAGNESIUM: 2.3 mg/dL (ref 1.7–2.4)
MAGNESIUM: 2.1 mg/dL (ref 1.7–2.4)

## 2018-04-08 LAB — CREATININE, SERUM
CREATININE: 0.96 mg/dL (ref 0.44–1.00)
GFR calc non Af Amer: >60 mL/min (ref >60)

## 2018-04-08 MED ORDER — ENOXAPARIN SODIUM 40 MG/0.4ML ~~LOC~~ SOLN
40.0000 mg | Freq: Every day | SUBCUTANEOUS | Status: DC
  Administered 2018-04-08 – 2018-04-10 (×3): 40 mg via SUBCUTANEOUS
  Filled 2018-04-08 (×3): qty 0.4

## 2018-04-08 MED ORDER — INSULIN ASPART 100 UNIT/ML ~~LOC~~ SOLN
0.0000 [IU] | SUBCUTANEOUS | Status: DC
  Administered 2018-04-08: 2 [IU] via SUBCUTANEOUS
  Administered 2018-04-08: 4 [IU] via SUBCUTANEOUS

## 2018-04-08 MED ORDER — POTASSIUM CHLORIDE 10 MEQ/50ML IV SOLN
10.0000 meq | INTRAVENOUS | Status: AC
  Administered 2018-04-08 – 2018-04-09 (×3): 10 meq via INTRAVENOUS
  Filled 2018-04-08 (×3): qty 50

## 2018-04-08 MED ORDER — HYDROCODONE-ACETAMINOPHEN 5-325 MG PO TABS
1.0000 | ORAL_TABLET | ORAL | Status: DC | PRN
  Administered 2018-04-09 – 2018-04-10 (×3): 1 via ORAL
  Filled 2018-04-08 (×2): qty 1
  Filled 2018-04-08: qty 2

## 2018-04-08 MED ORDER — KETOROLAC TROMETHAMINE 15 MG/ML IJ SOLN
15.0000 mg | Freq: Four times a day (QID) | INTRAMUSCULAR | Status: AC
  Administered 2018-04-08 – 2018-04-09 (×5): 15 mg via INTRAVENOUS
  Filled 2018-04-08 (×5): qty 1

## 2018-04-08 MED ORDER — POTASSIUM CHLORIDE 10 MEQ/50ML IV SOLN
10.0000 meq | INTRAVENOUS | Status: AC
  Administered 2018-04-08 (×3): 10 meq via INTRAVENOUS
  Filled 2018-04-08: qty 50

## 2018-04-08 MED ORDER — INSULIN REGULAR(HUMAN) IN NACL 100-0.9 UT/100ML-% IV SOLN
INTRAVENOUS | Status: AC
  Administered 2018-04-08: 100 [IU] via INTRAVENOUS
  Filled 2018-04-08: qty 100

## 2018-04-08 MED ORDER — FUROSEMIDE 10 MG/ML IJ SOLN
20.0000 mg | Freq: Two times a day (BID) | INTRAMUSCULAR | Status: AC
  Administered 2018-04-08 – 2018-04-09 (×4): 20 mg via INTRAVENOUS
  Filled 2018-04-08 (×4): qty 2

## 2018-04-08 NOTE — Progress Notes (Signed)
[name]       Ariana Ramirez,[place] [address]             [phone]        CARDIOTHORACIC SURGERY PROGRESS NOTE   R1 Day Post-Op Procedure(s) (LRB): MINIMALLY INVASIVE RESECTION OF TRICUSPID VALVE MASS (N/A) TRANSESOPHAGEAL ECHOCARDIOGRAM (TEE) (N/A)  Subjective: Looks good.  Mild soreness in chest.  No nausea.  No SOB  Objective: Vital signs: BP Readings from Last 1 Encounters:  04/08/18 (!) 104/52   Pulse Readings from Last 1 Encounters:  04/08/18 80   Resp Readings from Last 1 Encounters:  04/07/18 (!) 9   Temp Readings from Last 1 Encounters:  04/08/18 98.8 F (37.1 C) (Oral)    Hemodynamics: CVP:  [9 mmHg-11 mmHg] 9 mmHg  Physical Exam:  Rhythm:   sinus  Breath sounds: clear  Heart sounds:  RRR  Incisions:  Dressings dry, intact  Abdomen:  Soft, non-distended, non-tender  Extremities:  Warm, well-perfused  Chest tubes:  decreasing volume thin serosanguinous output, no air leak    Intake/Output from previous day: 10/18 0701 - 10/19 0700 In: 5519.6 [P.O.:240; I.V.:3535.8; Blood:370; IV Piggyback:1373.8] Out: 3090 [Urine:2290; Blood:350; Chest Tube:450] Intake/Output this shift: No intake/output data recorded.  Lab Results:  CBC: Recent Labs    04/07/18 1945 04/08/18 0046 04/08/18 0558  WBC 14.3*  --  12.1*  HGB 9.9* 8.5* 8.9*  HCT 30.3* 25.0* 28.8*  PLT 137*  --  126*    BMET:  Recent Labs    04/08/18 0046 04/08/18 0558  NA 140 137  K 4.0 3.6  CL 105 107  CO2  --  23  GLUCOSE 108* 120*  BUN 14 12  CREATININE 0.80 0.83  CALCIUM  --  7.6*     PT/INR:   Recent Labs    04/07/18 1235  LABPROT 18.2*  INR 1.53    CBG (last 3)  Recent Labs    04/08/18 0436 04/08/18 0604 04/08/18 0722  GLUCAP 102* 113* 131*    ABG    Component Value Date/Time   PHART 7.325 (L) 04/07/2018 1238   PCO2ART 54.1 (H) 04/07/2018 1238   PO2ART 139.0 (H) 04/07/2018 1238   HCO3 28.2 (H) 04/07/2018 1238   TCO2 24 04/08/2018 0046    ACIDBASEDEF 1.0 04/07/2018 1042   O2SAT 99.0 04/07/2018 1238    CXR: PORTABLE CHEST 1 VIEW  COMPARISON:  One-view chest x-ray 04/07/2018  FINDINGS: The heart size is exaggerated by low lung volumes. There is no edema. Left IJ line is stable.  Right-sided chest tubes are in place. A small right pleural effusion is present. There is no significant pneumothorax. Mild atelectasis is present. Epicardial pacing wires are in place.  IMPRESSION: 1. Right-sided chest tube is without significant pneumothorax. 2. Small right pleural effusion. 3. Low lung volumes and mild cardiomegaly.   Electronically Signed   By: Ariana Ramirez M.D.   On: 04/08/2018 08:49   EKG: NSR w/out acute ischemic changes    Assessment/Plan: S/P Procedure(s) (LRB): MINIMALLY INVASIVE RESECTION OF TRICUSPID VALVE MASS (N/A) TRANSESOPHAGEAL ECHOCARDIOGRAM (TEE) (N/A)  Doing well POD1 Maintaining NSR w/ stable BP Breathing comfortably w/ O2 sats 98% on RA, CXR clear Expected post op acute blood loss anemia, mild Expected post op atelectasis, mild Expected post op volume excess, weight reportedly 3 kg > preop, UOP adequate   Mobilize  Diuresis  D/C lines  Leave tubes in for now   Rexene Alberts, MD  04/08/2018 8:59 AM

## 2018-04-08 NOTE — Progress Notes (Signed)
[facility] BRIEF SICU PROGRESS NOTE  1 Day Post-Op  S/P Procedure(s) (LRB): MINIMALLY INVASIVE RESECTION OF TRICUSPID VALVE MASS (N/A) TRANSESOPHAGEAL ECHOCARDIOGRAM (TEE) (N/A)   Stable day.  Minimal pain NSR - AAI paced Stable BP Breathing comfortably on room air UOP excellent Labs okay  Plan: Continue routine care  Rexene Alberts, MD 04/08/2018 8:02 PM

## 2018-04-09 ENCOUNTER — Inpatient Hospital Stay (HOSPITAL_COMMUNITY): Payer: 59

## 2018-04-09 LAB — CBC
HCT: 27 % — ABNORMAL LOW (ref 36.0–46.0)
Hemoglobin: 8.6 g/dL — ABNORMAL LOW (ref 12.0–15.0)
MCH: 28.7 pg (ref 26.0–34.0)
MCHC: 31.9 g/dL (ref 30.0–36.0)
MCV: 90 fL (ref 80.0–100.0)
Platelets: 110 10*3/uL — ABNORMAL LOW (ref 150–400)
RBC: 3 MIL/uL — ABNORMAL LOW (ref 3.87–5.11)
RDW: 13.2 % (ref 11.5–15.5)
WBC: 10 10*3/uL (ref 4.0–10.5)
nRBC: 0 % (ref 0.0–0.2)

## 2018-04-09 LAB — BASIC METABOLIC PANEL
Anion gap: 5 (ref 5–15)
BUN: 12 mg/dL (ref 6–20)
CHLORIDE: 107 mmol/L (ref 98–111)
CO2: 27 mmol/L (ref 22–32)
CREATININE: 0.91 mg/dL (ref 0.44–1.00)
Calcium: 7.9 mg/dL — ABNORMAL LOW (ref 8.9–10.3)
GFR calc Af Amer: >60 mL/min (ref >60)
GFR calc non Af Amer: >60 mL/min (ref >60)
Glucose, Bld: 100 mg/dL — ABNORMAL HIGH (ref 70–99)
Potassium: 3.9 mmol/L (ref 3.5–5.1)
Sodium: 139 mmol/L (ref 135–145)

## 2018-04-09 LAB — GLUCOSE, CAPILLARY
GLUCOSE-CAPILLARY: 108 mg/dL — AB (ref 70–99)
GLUCOSE-CAPILLARY: 130 mg/dL — AB (ref 70–99)
Glucose-Capillary: 92 mg/dL (ref 70–99)
Glucose-Capillary: 91 mg/dL (ref 70–99)
Glucose-Capillary: 108 mg/dL — ABNORMAL HIGH (ref 70–99)
Glucose-Capillary: 94 mg/dL (ref 70–99)

## 2018-04-09 MED ORDER — SODIUM CHLORIDE 0.9 % IV SOLN: 250.0000 mL | INTRAVENOUS | Status: DC | PRN

## 2018-04-09 MED ORDER — SODIUM CHLORIDE 0.9% FLUSH
3.0000 mL | Freq: Two times a day (BID) | INTRAVENOUS | Status: DC
  Administered 2018-04-09 – 2018-04-10 (×4): 3 mL via INTRAVENOUS

## 2018-04-09 MED ORDER — INSULIN ASPART 100 UNIT/ML ~~LOC~~ SOLN
0.0000 [IU] | Freq: Three times a day (TID) | SUBCUTANEOUS | Status: DC
  Administered 2018-04-09 – 2018-04-10 (×2): 2 [IU] via SUBCUTANEOUS

## 2018-04-09 MED ORDER — MOVING RIGHT ALONG BOOK
Freq: Once | Status: AC
  Administered 2018-04-09: 12:00:00
  Filled 2018-04-09 (×2): qty 1

## 2018-04-09 MED ORDER — SODIUM CHLORIDE 0.9% FLUSH: 3.0000 mL | INTRAVENOUS | Status: DC | PRN

## 2018-04-09 MED ORDER — POTASSIUM CHLORIDE 10 MEQ/50ML IV SOLN
10.0000 meq | INTRAVENOUS | Status: AC
  Administered 2018-04-09 (×3): 10 meq via INTRAVENOUS
  Filled 2018-04-09 (×3): qty 50

## 2018-04-09 NOTE — Progress Notes (Signed)
Anesthesiology Follow-up:  Awake and alert, in good spirits, complaining of mild R. Groin discomfort, otherwise feeling well. Moved to 4E today from [place].  VS: T-  37.4 BP- 131/83 HR- 70 (SR) RR- 16 O2 sat 98% on RA  K-3.9 glucose-151 BUN/Cr.- 12/0.91 H/H- 8.6/27.0 Platelets- 71,36  57 year old female 2 days S/P resection of tricuspid valve papillary fibroelastoma via minimally invasive approach. Extubated in OR without difficulty. Stable post-op course.  Roberts Gaudy

## 2018-04-09 NOTE — Progress Notes (Signed)
      [address]       [place] [address]             [phone]        CARDIOTHORACIC SURGERY PROGRESS NOTE   R2 Days Post-Op Procedure(s) (LRB): MINIMALLY INVASIVE RESECTION OF TRICUSPID VALVE MASS (N/A) TRANSESOPHAGEAL ECHOCARDIOGRAM (TEE) (N/A)  Subjective: Looks good.  Had a good night.  No complaints  Objective: Vital signs: BP Readings from Last 1 Encounters:  04/09/18 109/77   Pulse Readings from Last 1 Encounters:  04/09/18 60   Resp Readings from Last 1 Encounters:  04/08/18 16   Temp Readings from Last 1 Encounters:  04/09/18 98.8 F (37.1 C) (Oral)    Hemodynamics:    Physical Exam:  Rhythm:   sinus  Breath sounds: clear  Heart sounds:  RRR  Incisions:  Dressings dry, intact  Abdomen:  Soft, non-distended, non-tender  Extremities:  Warm, well-perfused  Chest tubes:  decreasing volume thin serosanguinous output, no air leak    Intake/Output from previous day: 10/19 0701 - 10/20 0700 In: 1360.4 [P.O.:720; I.V.:154.4; IV Piggyback:486] Out: 2480 [Urine:2120; Chest Tube:360] Intake/Output this shift: No intake/output data recorded.  Lab Results:  CBC: Recent Labs    04/08/18 1804 04/08/18 2048 04/09/18 0418  WBC 10.5  --  10.0  HGB 9.3* 8.8* 8.6*  HCT 28.9* 26.0* 27.0*  PLT 118*  --  110*    BMET:  Recent Labs    04/08/18 0558  04/08/18 2048 04/09/18 0418  NA 137  --  140 139  K 3.6  --  3.4* 3.9  CL 107  --  101 107  CO2 23  --   --  27  GLUCOSE 120*  --  102* 100*  BUN 12  --  13 12  CREATININE 0.83   < > 0.90 0.91  CALCIUM 7.6*  --   --  7.9*   < > = values in this interval not displayed.     PT/INR:   Recent Labs    04/07/18 1235  LABPROT 18.2*  INR 1.53    CBG (last 3)  Recent Labs    04/08/18 2345 04/09/18 0415 04/09/18 0748  GLUCAP 148* 91 108*    ABG    Component Value Date/Time   PHART 7.325 (L) 04/07/2018 1238   PCO2ART 54.1 (H) 04/07/2018 1238   PO2ART 139.0 (H) 04/07/2018  1238   HCO3 28.2 (H) 04/07/2018 1238   TCO2 27 04/08/2018 2048   ACIDBASEDEF 1.0 04/07/2018 1042   O2SAT 99.0 04/07/2018 1238    CXR: n/a  Assessment/Plan: S/P Procedure(s) (LRB): MINIMALLY INVASIVE RESECTION OF TRICUSPID VALVE MASS (N/A) TRANSESOPHAGEAL ECHOCARDIOGRAM (TEE) (N/A)  Doing well POD2 Maintaining NSR w/ stable BP Breathing comfortably w/ O2 sats 100% on RA, CXR clear Expected post op acute blood loss anemia, mild Expected post op atelectasis, mild Expected post op volume excess, weight down 1 kg but still reportedly 2 kg > preop, UOP adequate   Mobilize  Diuresis  Leave tubes in one more day  Transfer step down   Rexene Alberts, MD 04/09/2018 8:55 AM

## 2018-04-09 NOTE — Progress Notes (Signed)
Pacing wires removed per doctors order. Tips intact. Patient tolerated well. INR 1.53. Remain flat for one hour post. Will continue to monitor closely.

## 2018-04-10 ENCOUNTER — Encounter (HOSPITAL_COMMUNITY): Payer: Self-pay | Admitting: Thoracic Surgery (Cardiothoracic Vascular Surgery)

## 2018-04-10 LAB — GLUCOSE, CAPILLARY
GLUCOSE-CAPILLARY: 120 mg/dL — AB (ref 70–99)
GLUCOSE-CAPILLARY: 79 mg/dL (ref 70–99)
Glucose-Capillary: 110 mg/dL — ABNORMAL HIGH (ref 70–99)
Glucose-Capillary: 91 mg/dL (ref 70–99)

## 2018-04-10 LAB — BASIC METABOLIC PANEL
ANION GAP: 3 — AB (ref 5–15)
BUN: 14 mg/dL (ref 6–20)
CALCIUM: 8.1 mg/dL — AB (ref 8.9–10.3)
CO2: 28 mmol/L (ref 22–32)
Chloride: 109 mmol/L (ref 98–111)
Creatinine, Ser: 0.98 mg/dL (ref 0.44–1.00)
GFR calc Af Amer: >60 mL/min (ref >60)
Glucose, Bld: 112 mg/dL — ABNORMAL HIGH (ref 70–99)
POTASSIUM: 4 mmol/L (ref 3.5–5.1)
SODIUM: 140 mmol/L (ref 135–145)

## 2018-04-10 LAB — CBC
HCT: 26.5 % — ABNORMAL LOW (ref 36.0–46.0)
Hemoglobin: 8.4 g/dL — ABNORMAL LOW (ref 12.0–15.0)
MCH: 28.2 pg (ref 26.0–34.0)
MCHC: 31.7 g/dL (ref 30.0–36.0)
MCV: 88.9 fL (ref 80.0–100.0)
NRBC: 0 % (ref 0.0–0.2)
PLATELETS: 120 10*3/uL — AB (ref 150–400)
RBC: 2.98 MIL/uL — AB (ref 3.87–5.11)
RDW: 13.2 % (ref 11.5–15.5)
WBC: 7.2 10*3/uL (ref 4.0–10.5)

## 2018-04-10 NOTE — Progress Notes (Signed)
PT chest tubes and q pump removed. PT tolerated very well. No complaints. Sat 93%. Jerald Kief, RN

## 2018-04-10 NOTE — Progress Notes (Addendum)
      [address]       [place] [address]             [phone]      3 Days Post-Op Procedure(s) (LRB): MINIMALLY INVASIVE RESECTION OF TRICUSPID VALVE MASS (N/A) TRANSESOPHAGEAL ECHOCARDIOGRAM (TEE) (N/A)   Subjective:  Ms. Koloski has complaints of pain at right chest.  She also states that her right thigh and groin is numb and burning  Objective: Vital signs in last 24 hours: Temp:  [97.7 F (36.5 C)-99.3 F (37.4 C)] 97.7 F (36.5 C) (10/21 0536) Pulse Rate:  [39-69] 67 (10/21 0536) Cardiac Rhythm: Normal sinus rhythm (10/21 0536) Resp:  [16-20] 20 (10/21 0536) BP: (84-136)/(50-83) 124/78 (10/21 0536) SpO2:  [93 %-100 %] 97 % (10/21 0536) Weight:  [86.9 kg] 86.9 kg (10/21 0500)  Intake/Output from previous day: 10/20 0701 - 10/21 0700 In: 512.7 [P.O.:480; I.V.:32.7] Out: 210 [Chest Tube:210]  General appearance: alert, cooperative and no distress Heart: regular rate and rhythm Lungs: clear to auscultation bilaterally Abdomen: soft, non-tender; bowel sounds normal; no masses,  no organomegaly Extremities: edema trace Wound: clean and dry  Lab Results: Recent Labs    04/09/18 0418 04/10/18 0334  WBC 10.0 7.2  HGB 8.6* 8.4*  HCT 27.0* 26.5*  PLT 110* 120*   BMET:  Recent Labs    04/09/18 0418 04/10/18 0334  NA 139 140  K 3.9 4.0  CL 107 109  CO2 27 28  GLUCOSE 100* 112*  BUN 12 14  CREATININE 0.91 0.98  CALCIUM 7.9* 8.1*    PT/INR:  Recent Labs    04/07/18 1235  LABPROT 18.2*  INR 1.53   ABG    Component Value Date/Time   PHART 7.325 (L) 04/07/2018 1238   HCO3 28.2 (H) 04/07/2018 1238   TCO2 27 04/08/2018 2048   ACIDBASEDEF 1.0 04/07/2018 1042   O2SAT 99.0 04/07/2018 1238   CBG (last 3)  Recent Labs    04/09/18 1711 04/09/18 2113 04/10/18 0639  GLUCAP 108* 130* 120*    Assessment/Plan: S/P Procedure(s) (LRB): MINIMALLY INVASIVE RESECTION OF TRICUSPID VALVE MASS (N/A) TRANSESOPHAGEAL ECHOCARDIOGRAM (TEE)  (N/A)  1. CV- Sinus Bradycardia, continue Lopressor at 12.5 mg BID 2. Pulm- no acute issues, CT output 200 cc recorded for yesterday, drainage is serous can likely dc chest tubes today 3. Renal- creatinine WNL, weight is trending down IV lasix is completed, may benefit from some additional oral Lasix 4. Expected post operative blood loss anemia, mild at 8.4 5. DM- sugars controlled, continue current regimen 6. Dispo- patient stable, maintaining Sinus Bradycardia, likely d/c chest tube today, possible resumption of oral Lasix, if CTs removed today, possibly ready for d/c in AM   LOS: 3 days    Erin Barrett 04/10/2018  I have seen and examined the patient and agree with the assessment and plan as outlined.  Rexene Alberts, MD 04/10/2018 9:13 AM

## 2018-04-10 NOTE — Progress Notes (Signed)
CARDIAC REHAB PHASE I   PRE:  Rate/Rhythm: 25 SR  BP:  Supine:   Sitting: 139/70  Standing:    SaO2: 93%RA  MODE:  Ambulation: 420 ft   POST:  Rate/Rhythm: 69 SR  BP:  Supine:   Sitting: 165/63  Standing:    SaO2: 99%RA 1050-1120 Pt walked 420 ft on RA with steady gait. Needs assistance to stand due to right groin discomfort. I carried chest tube. Gait slow and steady.  Right groin discomfort improves with walk. To recliner after walk.   Graylon Good, RN BSN  04/10/2018 11:17 AM

## 2018-04-11 ENCOUNTER — Inpatient Hospital Stay (HOSPITAL_COMMUNITY): Payer: 59

## 2018-04-11 LAB — GLUCOSE, CAPILLARY: Glucose-Capillary: 92 mg/dL (ref 70–99)

## 2018-04-11 MED ORDER — HYDROCODONE-ACETAMINOPHEN 5-325 MG PO TABS: 1.0000 | ORAL_TABLET | ORAL | 0 refills | Status: DC | PRN

## 2018-04-11 MED ORDER — METOPROLOL TARTRATE 25 MG PO TABS: 12.5000 mg | ORAL_TABLET | Freq: Two times a day (BID) | ORAL | 3 refills | Status: DC

## 2018-04-11 MED FILL — HYDROCODON-APAP 5-325: 5-325 | 8 days supply | Qty: 30 | Fill #0

## 2018-04-11 MED FILL — METOPROLOL TARTRATE 25 MG T: 25 | 30 days supply | Qty: 30 | Fill #0 | Status: TO

## 2018-04-11 MED FILL — Heparin Sodium (Porcine) Inj 1000 Unit/ML: INTRAMUSCULAR | Qty: 30 | Status: AC

## 2018-04-11 MED FILL — Potassium Chloride Inj 2 mEq/ML: INTRAVENOUS | Qty: 40 | Status: AC

## 2018-04-11 MED FILL — Magnesium Sulfate Inj 50%: INTRAMUSCULAR | Qty: 10 | Status: AC

## 2018-04-11 MED FILL — Cefuroxime Sodium For Inj 750 MG: INTRAMUSCULAR | Qty: 750 | Status: AC

## 2018-04-11 MED FILL — Dexmedetomidine HCl in NaCl 0.9% IV Soln 400 MCG/100ML: INTRAVENOUS | Qty: 100 | Status: AC

## 2018-04-11 NOTE — Progress Notes (Signed)
      [address]       [place] [address]             [phone]       4 Days Post-Op Procedure(s) (LRB): MINIMALLY INVASIVE RESECTION OF TRICUSPID VALVE MASS (N/A) TRANSESOPHAGEAL ECHOCARDIOGRAM (TEE) (N/A)   Subjective:  No new complaints.  Feels pretty good and is ready to go home.  Objective: Vital signs in last 24 hours: Temp:  [98.2 F (36.8 C)-99.5 F (37.5 C)] 98.6 F (37 C) (10/22 0818) Pulse Rate:  [64-69] 68 (10/22 0818) Cardiac Rhythm: Normal sinus rhythm (10/22 0818) Resp:  [17-23] 18 (10/22 0818) BP: (127-147)/(63-82) 130/65 (10/22 0818) SpO2:  [93 %-97 %] 96 % (10/22 0818) Weight:  [85.4 kg] 85.4 kg (10/22 0610)  General appearance: alert, cooperative and no distress Heart: regular rate and rhythm Lungs: clear to auscultation bilaterally Abdomen: soft, non-tender; bowel sounds normal; no masses,  no organomegaly Extremities: edema trace Wound: clean and dry  Lab Results: Recent Labs    04/09/18 0418 04/10/18 0334  WBC 10.0 7.2  HGB 8.6* 8.4*  HCT 27.0* 26.5*  PLT 110* 120*   BMET:  Recent Labs    04/09/18 0418 04/10/18 0334  NA 139 140  K 3.9 4.0  CL 107 109  CO2 27 28  GLUCOSE 100* 112*  BUN 12 14  CREATININE 0.91 0.98  CALCIUM 7.9* 8.1*    PT/INR: No results for input(s): LABPROT, INR in the last 72 hours. ABG    Component Value Date/Time   PHART 7.325 (L) 04/07/2018 1238   HCO3 28.2 (H) 04/07/2018 1238   TCO2 27 04/08/2018 2048   ACIDBASEDEF 1.0 04/07/2018 1042   O2SAT 99.0 04/07/2018 1238   CBG (last 3)  Recent Labs    04/10/18 1623 04/10/18 2134 04/11/18 0604  GLUCAP 79 91 92    Assessment/Plan: S/P Procedure(s) (LRB): MINIMALLY INVASIVE RESECTION OF TRICUSPID VALVE MASS (N/A) TRANSESOPHAGEAL ECHOCARDIOGRAM (TEE) (N/A)  1. CV- NSR, hypertensive at times- continue Lopressor 12.5 mg BID 2. Pulm- no acute issues, CXR shows trace apical pneumothorax, minimal right pleural effusion, off oxygen,  continue IS 3. Renal- creatinine WNL 4. DM- sugars controlled 5. Dispo- patient stable, will d/c home today   LOS: 4 days    Ellwood Handler 04/11/2018

## 2018-04-11 NOTE — Progress Notes (Signed)
Pt educated and provided discharge instructions. Pt received meds from pharmacy. Iv intact and removed. Pt denies any complaints. Vitals stable. Pt has all belongings. Pt tx via wheelchair to meet family. Jerald Kief, RN

## 2018-04-11 NOTE — Progress Notes (Signed)
CARDIAC REHAB PHASE I   Discharge education completed with pt and husband. Pt educated monitor incisions and shower daily. Reviewed importance of continued IS use. Pt given exercise guidelines. Reviewed diabetic diet with pt, pt declining a handout. Pt eager for d/c. RN made aware.   [id number] Rufina Falco, RN BSN 04/11/2018 10:14 AM

## 2018-04-11 NOTE — Discharge Instructions (Signed)
Discharge Instructions:  1. You may shower, please wash incisions daily with soap and water and keep dry.  If you wish to cover wounds with dressing you may do so but please keep clean and change daily.  No tub baths or swimming until incisions have completely healed.  If your incisions become red or develop any drainage please call our office at [phone]  2. No Driving until cleared by Dr. Guy Sandifer office and you are no longer using narcotic pain medications  3. Monitor your weight daily.. Please use the same scale and weigh at same time... If you gain 5-10 lbs in 48 hours with associated lower extremity swelling, please contact our office at [phone]  4. Fever of 101.5 for at least 24 hours with no source, please contact our office at [phone]  5. Activity- up as tolerated, please walk at least 3 times per day.  Avoid strenuous activity  6. If any questions or concerns arise, please do not hesitate to contact our office at [phone]

## 2018-04-12 ENCOUNTER — Other Ambulatory Visit: Payer: Self-pay | Admitting: *Deleted

## 2018-04-12 LAB — AEROBIC/ANAEROBIC CULTURE (SURGICAL/DEEP WOUND): CULTURE: NO GROWTH

## 2018-04-12 LAB — AEROBIC/ANAEROBIC CULTURE W GRAM STAIN (SURGICAL/DEEP WOUND): Gram Stain: NONE SEEN

## 2018-04-12 MED FILL — Mannitol IV Soln 20%: INTRAVENOUS | Qty: 1000 | Status: AC

## 2018-04-12 MED FILL — Sodium Bicarbonate IV Soln 8.4%: INTRAVENOUS | Qty: 50 | Status: AC

## 2018-04-12 MED FILL — Heparin Sodium (Porcine) Inj 1000 Unit/ML: INTRAMUSCULAR | Qty: 30 | Status: AC

## 2018-04-12 MED FILL — Lidocaine HCl(Cardiac) IV PF Soln Pref Syr 100 MG/5ML (2%): INTRAVENOUS | Qty: 25 | Status: AC

## 2018-04-12 MED FILL — Electrolyte-R (PH 7.4) Solution: INTRAVENOUS | Qty: 4000 | Status: AC

## 2018-04-12 MED FILL — Sodium Chloride IV Soln 0.9%: INTRAVENOUS | Qty: 2000 | Status: AC

## 2018-04-12 NOTE — Patient Outreach (Signed)
[facility]) Care Management  04/12/2018  Ariana Ramirez December 17, 1960 [id number]   Subjective: Telephone call to patient's home  / mobile number, no answer, left HIPAA compliant voicemail message, and requested call back.    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   Patient also has a history of diabetes, pulmonary hypertension, and hyperlipidemia.      Assessment: Received UMR Preoperative  / Transition of care referral on 04/03/18.   Preoperative call completed, and Transition of care follow up pending patient contact.       Plan: RNCM will send unsuccessful outreach  letter, [facility] pamphlet, will call patient for 2nd telephone outreach attempt, transition of care follow up, and proceed with case closure, within 10 business days if no return call.        Breslin Hemann H. Annia Friendly, BSN, [place] Management [facility] Telephonic CM Phone: [phone] Fax: [phone]

## 2018-04-13 ENCOUNTER — Other Ambulatory Visit: Payer: Self-pay | Admitting: *Deleted

## 2018-04-13 ENCOUNTER — Telehealth: Payer: Self-pay

## 2018-04-13 NOTE — Telephone Encounter (Signed)
Patient contacted the office requesting to take Ibuprofen for "inflammation" in her groin at her incision site after surgery with Dr. Roxy Manns 04/07/2018 to have a mass removed from her Tricuspid valve.  She stated she was having "stabbing" like pains at the incision.  I advised patient that she could take the medication as directed.  She acknowledged receipt.

## 2018-04-13 NOTE — Patient Outreach (Signed)
[facility]) Care Management  04/13/2018  Hilliary Jock [birth year] [id number]   Subjective: Telephone call to patient's home  / mobile number, no answer, left HIPAA compliant voicemail message, and requested call back.    Objective:Per KPN (Knowledge Performance Now, point of care tool) and chart review,patient hospitalized   04/07/18 - 04/11/18 forTricuspid valve mass, status post Minimally-InvasiveResection of Papillary Fibroelastoma of Tricuspid Valve on 04/07/18 at [facility]. Patient also has a history of diabetes, pulmonary hypertension, and hyperlipidemia.      Assessment:Received UMR Preoperative / Transition of care referral on 04/03/18. Preoperative call completed, and Transition of care follow up pending patient contact.       Plan:RNCM has sent unsuccessful outreach  letter, [facility] pamphlet, will call patient for 3rd telephone outreach attempt, transition of care follow up, and proceed with case closure, within 10 business days if no return call.       Keshawn Fiorito H. Annia Friendly, BSN, [place] Management [facility] Telephonic CM Phone: [phone] Fax: [phone]

## 2018-04-14 ENCOUNTER — Encounter: Payer: Self-pay | Admitting: *Deleted

## 2018-04-14 ENCOUNTER — Other Ambulatory Visit: Payer: Self-pay | Admitting: *Deleted

## 2018-04-14 NOTE — Patient Outreach (Signed)
[place] [facility]) Care Management  04/14/2018  Ariana Ramirez 20-Mar-1961 [id number]   Subjective: Telephone call to patient's home / mobile number, spoke with patient, and HIPAA verified.  Discussed [facility] Care Management UMR Transition of care follow up, patient voiced understanding, and is in agreement to follow up.  Patient states she is hanging on, doing okay, has a follow up with surgeon on 04/18/28 to remove sutures, and office visit on 04/24/18.   States the surgeon's office just called to reschedule office visit appointment from 04/25/18 to 04/24/18.  Patient states she is able to manage self care and has assistance as needed with activities of daily living/ home managment.  Patient voices understanding of medical diagnosis, surgery, and treatment plan. [facility] benefits discussed on 04/04/18 preoperative call and patient states no additional questions at this time. Patient states she does not have any education material, transition of care, care coordination, disease management, disease monitoring, transportation, community resource, or pharmacy needs at this time.  States she is very appreciative of the follow up and is in agreement to receive [place] Management information.       Objective:Per KPN (Knowledge Performance Now, point of care tool) and chart review,patienthospitalized10/18/19 - 10/22/19forTricuspid valve mass, status postMinimally-InvasiveResection of Papillary Fibroelastoma of Tricuspid Valveon 10/18/19at [facility]. Patient also has a history of diabetes, pulmonary hypertension, and hyperlipidemia.      Assessment:Received UMR Preoperative / Transition of care referral on 04/03/18. Preoperative call completed, andTransition of care follow up completed, no care management needs, and will proceed with case closure.       Plan:RNCM will send patient successful outreach letter, [facility] pamphlet, and magnet. RNCM will complete case  closure due to follow up completed / no care management needs.         Cordelro Gautreau H. Annia Friendly, BSN, [place] Management [facility] Telephonic CM Phone: [phone] Fax: [phone]

## 2018-04-18 ENCOUNTER — Encounter (INDEPENDENT_AMBULATORY_CARE_PROVIDER_SITE_OTHER): Payer: Self-pay

## 2018-04-18 DIAGNOSIS — Z4802 Encounter for removal of sutures: Secondary | ICD-10-CM

## 2018-04-20 ENCOUNTER — Other Ambulatory Visit: Payer: Self-pay | Admitting: Thoracic Surgery (Cardiothoracic Vascular Surgery)

## 2018-04-20 DIAGNOSIS — I078 Other rheumatic tricuspid valve diseases: Secondary | ICD-10-CM

## 2018-04-24 ENCOUNTER — Other Ambulatory Visit: Payer: Self-pay

## 2018-04-24 ENCOUNTER — Ambulatory Visit (INDEPENDENT_AMBULATORY_CARE_PROVIDER_SITE_OTHER): Payer: Self-pay | Admitting: Physician Assistant

## 2018-04-24 ENCOUNTER — Ambulatory Visit: Payer: 59 | Admitting: Nurse Practitioner

## 2018-04-24 ENCOUNTER — Ambulatory Visit
Admission: RE | Admit: 2018-04-24 | Discharge: 2018-04-24 | Disposition: A | Discharge status: Home / Self Care | Payer: 59 | Source: Ambulatory Visit | Attending: Thoracic Surgery (Cardiothoracic Vascular Surgery) | Admitting: Thoracic Surgery (Cardiothoracic Vascular Surgery)

## 2018-04-24 VITALS — BP 98/70 | HR 87 | Resp 18 | Ht 66.0 in | Wt 177.6 lb

## 2018-04-24 DIAGNOSIS — I078 Other rheumatic tricuspid valve diseases: Secondary | ICD-10-CM

## 2018-04-24 DIAGNOSIS — D151 Benign neoplasm of heart: Secondary | ICD-10-CM

## 2018-04-24 DIAGNOSIS — J90 Pleural effusion, not elsewhere classified: Secondary | ICD-10-CM | POA: Diagnosis not present

## 2018-04-24 MED FILL — JANUMET XR 100-1,000 MG TAB: 100-1000 | 30 days supply | Qty: 30 | Fill #0

## 2018-04-24 MED FILL — ATORVASTATIN 80 MG TABLET: 80 | 90 days supply | Qty: 90 | Fill #0

## 2018-04-24 NOTE — Progress Notes (Signed)
[address]       [place] [address]             [phone]      Ariana Ramirez is a 57 y.o. female patient status post minimally invasive resection of a papillary fibroblastoma from her tricuspid valve.  Her postoperative period in the hospital was routine.   1. s/p minimally invasive resection of papillary fibroelastoma from tricuspid valve    Past Medical History:  Diagnosis Date  . Diabetes mellitus   . GERD (gastroesophageal reflux disease)   . Hyperlipidemia   . Pulmonary hypertension ([place])   . s/p minimally invasive resection of papillary fibroelastoma from tricuspid valve 04/07/2018   No past surgical history pertinent negatives on file. Scheduled Meds: Current Outpatient Medications on File Prior to Visit  Medication Sig Dispense Refill  . fluticasone (FLONASE) 50 MCG/ACT nasal spray Place 2 sprays into both nostrils daily as needed for allergies.    Marland Kitchen HYDROcodone-acetaminophen (NORCO/VICODIN) 5-325 MG tablet Take 1 tablet by mouth every 4 (four) hours as needed for moderate pain or severe pain. 30 tablet 0  . JANUMET XR [phone] MG TB24 Take 1 tablet by mouth daily.  3  . metoprolol tartrate (LOPRESSOR) 25 MG tablet Take 0.5 tablets (12.5 mg total) by mouth 2 (two) times daily. 30 tablet 3  . Multiple Vitamin (MULTIVITAMIN WITH MINERALS) TABS tablet Take 1 tablet by mouth every other day.     . pantoprazole (PROTONIX) 40 MG tablet Take 40 mg by mouth 2 (two) times daily.    . simvastatin (ZOCOR) 40 MG tablet Take 40 mg by mouth every evening.    . triamterene-hydrochlorothiazide (MAXZIDE-25) 37.5-25 MG per tablet Take 1 tablet by mouth daily.     No current facility-administered medications on file prior to visit.     Allergies  Allergen Reactions  . Aspirin Other (See Comments)    GI intolerance  . Percocet [Oxycodone-Acetaminophen] Nausea And Vomiting    Extreme vomiting  . Food Rash    Fresh apples, pears, peaches and nectarines cause  rash on mouth and in throat (able to tolerate canned fruit)  . Pork-Derived Products Other (See Comments)    Personal preference - patient ok with medications; she prefers not to eat pork products    Blood pressure 98/70, pulse 87, resp. rate 18, height 5\' 6"  (1.676 m), weight 177 lb 9.6 oz (80.6 kg), SpO2 97 %.  Subjective   The patient is a 57 year old female status post minimally invasive tricuspid valve mass removal by Dr. Roxy Manns on 04/07/2018.  Overall, the patient is doing well.  She does complain of fatigue.  She does have a lack of appetite and has not been eating much.  She also shares that she is having some right inner thigh pain and numbness.  It is especially painful while walking.  Objective  Cor: RRR, no murmur Pulm: CTA bilaterally Abd: No tenderness Wound: Clean and dry thoracotomy incision and femoral cut down site on right groin Ext: No edema, good distal pulses, feet are well perfused.   CLINICAL DATA:  Status post resection of cardiac mass.  EXAM: CHEST - 2 VIEW  COMPARISON:  04/11/2018  FINDINGS: Heart size is normal. No change in the mediastinal appearance. The left chest is clear. Areas of linear atelectasis at the right lung base are improved but not completely resolved. Resolution of the small effusions.  IMPRESSION: Radiographic improvement. Resolution of small effusions. Better aeration at the  right lung base. No worsening or new finding.   Electronically Signed   By: Nelson Chimes M.D.   On: 04/24/2018 15:28  Assessment & Plan   Today, the patient presents for her routine 2-week follow-up appointment.  Her chest x-ray was reviewed with the patient which shows resolution of bilateral pleural effusions.  She does have some right inner thigh pain and numbness which should resolve on its own over the next few weeks.  Her femoral cutdown site was clean and dry with a tiny pinhole mid incision.  There was no drainage.  There was no hematoma or  lymphocele.  There was no tenderness over the actual incision.  Her right thoracotomy site was clean, dry, and intact without drainage.  Her chest tube sutures were removed a week ago and the site looked clean.  She also shares that she does not have much appetite and tends to perspire before and during eating.  She is a diabetic and on one thousand milligrams of metformin daily so she may be hypoglycemic.  I encouraged her to take her sugar level several times a day and record what they are.  She is to reach out to our office or her primary care provider if her blood glucose is running low.  Her blood pressure was also a little low today in the clinic it was 98/70.  Could consider discontinuing Maxzide however, it is hard to know what her blood pressure is running on a daily basis.  I encouraged her to take her blood pressure at home and keep this record for when she sees cardiology.  She is only on metoprolol 12.5 mg twice daily and not on any additional blood pressure medicine.  She is encouraged to drink Glucerna 3 times a day until her appetite picks up.  Her husband mentioned that he bought some yogurt that she was interested in eating.  I advised that most yogurts are high in sugar and to be aware of what brand you buy.  However, any oral intake at this point would be better than none at all.  She also needs to drink 6 to 8 glasses of water a day to maintain hydration.  She required diuretics while in the hospital, however on exam today she was not fluid overloaded.  She will follow-up with cardiology in a few weeks and follow-up with Dr. Roxy Manns in 2 months.  She is encouraged to reach out to our office if any new problems arise or if there are any changes in her current medical condition.  All questions were answered to the patient's and husband satisfaction.  Elgie Collard 04/24/2018

## 2018-04-24 NOTE — Telephone Encounter (Signed)
lvm to rescheduled pt's appt due to having another appt today with Dr. Roxy Manns.

## 2018-04-24 NOTE — Patient Instructions (Signed)
Make every effort to stay physically active, get some type of exercise on a regular basis, and stick to a "heart healthy diet".  The long term benefits for regular exercise and a healthy diet are critically important to your overall health and wellbeing.  You are encouraged to enroll and participate in the outpatient cardiac rehab program beginning as soon as practical.  Make every effort to keep your diabetes under very tight control.  Follow up closely with your primary care physician or endocrinologist and strive to keep their hemoglobin A1c levels as low as possible, preferably near or below 6.0.  The long term benefits of strict control of diabetes are far reaching and critically important for your overall health and survival.  Make every effort to maintain a "heart-healthy" lifestyle with regular physical exercise and adherence to a low-fat, low-carbohydrate diet.  Continue to seek regular follow-up appointments with your primary care physician and/or cardiologist.  Increase oral intake-Glucerna TID Monitor BP and blood glucose level

## 2018-04-25 ENCOUNTER — Ambulatory Visit: Payer: 59 | Admitting: Thoracic Surgery (Cardiothoracic Vascular Surgery)

## 2018-04-27 NOTE — Telephone Encounter (Signed)
Pt has called office to reschedule appt.

## 2018-05-11 MED FILL — METOPROLOL TARTRATE 25 MG T: 25 | 30 days supply | Qty: 30 | Fill #0

## 2018-05-15 DIAGNOSIS — L509 Urticaria, unspecified: Secondary | ICD-10-CM | POA: Diagnosis not present

## 2018-05-15 MED FILL — TRIAMCINOLONE 0.1% CREAM: 0.1 | 15 days supply | Qty: 80 | Fill #0

## 2018-05-15 MED FILL — predniSONE 20 MG TABS: 20 | 5 days supply | Qty: 7 | Fill #0

## 2018-05-16 ENCOUNTER — Ambulatory Visit (INDEPENDENT_AMBULATORY_CARE_PROVIDER_SITE_OTHER): Payer: 59 | Admitting: Nurse Practitioner

## 2018-05-16 ENCOUNTER — Encounter: Payer: Self-pay | Admitting: Nurse Practitioner

## 2018-05-16 VITALS — BP 124/72 | HR 78 | Ht 66.0 in | Wt 177.1 lb

## 2018-05-16 DIAGNOSIS — I272 Pulmonary hypertension, unspecified: Secondary | ICD-10-CM

## 2018-05-16 DIAGNOSIS — I078 Other rheumatic tricuspid valve diseases: Secondary | ICD-10-CM

## 2018-05-16 DIAGNOSIS — Z9889 Other specified postprocedural states: Secondary | ICD-10-CM

## 2018-05-16 LAB — BASIC METABOLIC PANEL
BUN/Creatinine Ratio: 16 (ref 9–23)
BUN: 19 mg/dL (ref 6–24)
CO2: 24 mmol/L (ref 20–29)
Calcium: 9.2 mg/dL (ref 8.7–10.2)
Chloride: 96 mmol/L (ref 96–106)
Creatinine, Ser: 1.21 mg/dL — ABNORMAL HIGH (ref 0.57–1.00)
GFR calc Af Amer: 58 mL/min/{1.73_m2} — ABNORMAL LOW (ref >59)
GFR calc non Af Amer: 50 mL/min/{1.73_m2} — ABNORMAL LOW (ref >59)
Glucose: 156 mg/dL — ABNORMAL HIGH (ref 65–99)
Potassium: 3.6 mmol/L (ref 3.5–5.2)
Sodium: 139 mmol/L (ref 134–144)

## 2018-05-16 LAB — CBC
Hematocrit: 31.4 % — ABNORMAL LOW (ref 34.0–46.6)
Hemoglobin: 10.5 g/dL — ABNORMAL LOW (ref 11.1–15.9)
MCH: 28.9 pg (ref 26.6–33.0)
MCHC: 33.4 g/dL (ref 31.5–35.7)
MCV: 87 fL (ref 79–97)
Platelets: 240 10*3/uL (ref 150–450)
RBC: 3.63 x10E6/uL — ABNORMAL LOW (ref 3.77–5.28)
RDW: 13.3 % (ref 12.3–15.4)
WBC: 10.6 10*3/uL (ref 3.4–10.8)
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
## 2018-05-16 NOTE — Patient Instructions (Addendum)
We will be checking the following labs today - BMET & CBC  If you have labs (blood work) drawn today and your tests are completely normal, you will receive your results only by: Marland Kitchen MyChart Message (if you have MyChart) OR . A paper copy in the mail If you have any lab test that is abnormal or we need to change your treatment, we will call you to review the results.   Medication Instructions:    Continue with your current medicines. BUT  I am stopping Metoprolol today  Finish the Prednisone as prescribed by PCP   If you need a refill on your cardiac medications before your next appointment, please call your pharmacy.     Testing/Procedures To Be Arranged:  N/A  Follow-Up:   See me in about a month    At [facility], you and your health needs are our priority.  As part of our continuing mission to provide you with exceptional heart care, we have created designated Provider Care Teams.  These Care Teams include your primary Cardiologist (physician) and Advanced Practice Providers (APPs -  Physician Assistants and Nurse Practitioners) who all work together to provide you with the care you need, when you need it.  Special Instructions:  . None  Call the [place] office at [phone] if you have any questions, problems or concerns.

## 2018-05-30 MED FILL — JANUMET XR 100-1,000 MG TAB: 100-1000 | 30 days supply | Qty: 30 | Fill #1

## 2018-05-30 MED FILL — TRIAMCINOLONE 0.1% CREAM: 0.1 | 15 days supply | Qty: 80 | Fill #1

## 2018-06-12 ENCOUNTER — Encounter: Payer: Self-pay | Admitting: Nurse Practitioner

## 2018-06-12 ENCOUNTER — Other Ambulatory Visit: Payer: Self-pay

## 2018-06-12 ENCOUNTER — Ambulatory Visit (INDEPENDENT_AMBULATORY_CARE_PROVIDER_SITE_OTHER): Payer: 59 | Admitting: Nurse Practitioner

## 2018-06-12 ENCOUNTER — Ambulatory Visit (HOSPITAL_COMMUNITY): Payer: 59 | Attending: Cardiovascular Disease

## 2018-06-12 ENCOUNTER — Other Ambulatory Visit (HOSPITAL_COMMUNITY): Payer: 59

## 2018-06-12 ENCOUNTER — Telehealth: Payer: Self-pay

## 2018-06-12 VITALS — BP 118/72 | HR 72 | Ht 66.0 in | Wt 178.0 lb

## 2018-06-12 DIAGNOSIS — Z9889 Other specified postprocedural states: Secondary | ICD-10-CM

## 2018-06-12 DIAGNOSIS — I078 Other rheumatic tricuspid valve diseases: Secondary | ICD-10-CM | POA: Insufficient documentation

## 2018-06-12 MED FILL — PANTOPRAZOLE SOD DR 40 MG T: 40 | 90 days supply | Qty: 180 | Fill #0

## 2018-06-12 MED FILL — TRIAMTERENE/HCTZ 37.5/25 TB: 37.5-25 | 90 days supply | Qty: 90 | Fill #2

## 2018-06-12 NOTE — Telephone Encounter (Signed)
Ariana Ramirez came by the office for a return to work note. She is requesting to return on 06/19/2018. Note was signed

## 2018-06-12 NOTE — Patient Instructions (Addendum)
We will be checking the following labs today - NONE   Medication Instructions:    Continue with your current medicines.    If you need a refill on your cardiac medications before your next appointment, please call your pharmacy.     Testing/Procedures To Be Arranged:  N/A  Follow-Up:   See Dr. Tamala Julian in 3 months August 31, 2018 @ 10 AM  See Dr. Roxy Manns as planned.     At [facility], you and your health needs are our priority.  As part of our continuing mission to provide you with exceptional heart care, we have created designated Provider Care Teams.  These Care Teams include your primary Cardiologist (physician) and Advanced Practice Providers (APPs -  Physician Assistants and Nurse Practitioners) who all work together to provide you with the care you need, when you need it.  Special Instructions:  . None  Call the [place] office at [phone] if you have any questions, problems or concerns.
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
## 2018-06-20 ENCOUNTER — Other Ambulatory Visit: Payer: Self-pay

## 2018-06-20 ENCOUNTER — Emergency Department (HOSPITAL_COMMUNITY): Payer: 59

## 2018-06-20 ENCOUNTER — Emergency Department (HOSPITAL_COMMUNITY)
Admission: EM | Admit: 2018-06-20 | Discharge: 2018-06-20 | Disposition: A | Discharge status: Home / Self Care | Payer: 59 | Attending: Emergency Medicine | Admitting: Emergency Medicine

## 2018-06-20 ENCOUNTER — Telehealth: Payer: Self-pay | Admitting: Cardiology

## 2018-06-20 ENCOUNTER — Encounter (HOSPITAL_COMMUNITY): Payer: Self-pay | Admitting: Emergency Medicine

## 2018-06-20 ENCOUNTER — Emergency Department (HOSPITAL_BASED_OUTPATIENT_CLINIC_OR_DEPARTMENT_OTHER): Payer: 59

## 2018-06-20 DIAGNOSIS — R0789 Other chest pain: Secondary | ICD-10-CM | POA: Diagnosis not present

## 2018-06-20 DIAGNOSIS — Z79899 Other long term (current) drug therapy: Secondary | ICD-10-CM | POA: Diagnosis not present

## 2018-06-20 DIAGNOSIS — Z7984 Long term (current) use of oral hypoglycemic drugs: Secondary | ICD-10-CM | POA: Diagnosis not present

## 2018-06-20 DIAGNOSIS — G8918 Other acute postprocedural pain: Secondary | ICD-10-CM | POA: Diagnosis not present

## 2018-06-20 DIAGNOSIS — I071 Rheumatic tricuspid insufficiency: Secondary | ICD-10-CM

## 2018-06-20 DIAGNOSIS — R071 Chest pain on breathing: Secondary | ICD-10-CM

## 2018-06-20 DIAGNOSIS — E119 Type 2 diabetes mellitus without complications: Secondary | ICD-10-CM | POA: Insufficient documentation

## 2018-06-20 DIAGNOSIS — R002 Palpitations: Secondary | ICD-10-CM | POA: Diagnosis not present

## 2018-06-20 DIAGNOSIS — I361 Nonrheumatic tricuspid (valve) insufficiency: Secondary | ICD-10-CM | POA: Diagnosis not present

## 2018-06-20 DIAGNOSIS — R072 Precordial pain: Secondary | ICD-10-CM | POA: Insufficient documentation

## 2018-06-20 DIAGNOSIS — R918 Other nonspecific abnormal finding of lung field: Secondary | ICD-10-CM | POA: Diagnosis not present

## 2018-06-20 DIAGNOSIS — I300 Acute nonspecific idiopathic pericarditis: Secondary | ICD-10-CM

## 2018-06-20 DIAGNOSIS — R079 Chest pain, unspecified: Secondary | ICD-10-CM | POA: Diagnosis not present

## 2018-06-20 LAB — BASIC METABOLIC PANEL
Anion gap: 13 (ref 5–15)
BUN: 20 mg/dL (ref 6–20)
CO2: 26 mmol/L (ref 22–32)
Calcium: 9.4 mg/dL (ref 8.9–10.3)
Chloride: 99 mmol/L (ref 98–111)
Creatinine, Ser: 1 mg/dL (ref 0.44–1.00)
GFR calc Af Amer: >60 mL/min (ref >60)
GFR calc non Af Amer: >60 mL/min (ref >60)
Glucose, Bld: 165 mg/dL — ABNORMAL HIGH (ref 70–99)
POTASSIUM: 3.5 mmol/L (ref 3.5–5.1)
Sodium: 138 mmol/L (ref 135–145)

## 2018-06-20 LAB — CBC
HCT: 33.8 % — ABNORMAL LOW (ref 36.0–46.0)
Hemoglobin: 10.6 g/dL — ABNORMAL LOW (ref 12.0–15.0)
MCH: 28 pg (ref 26.0–34.0)
MCHC: 31.4 g/dL (ref 30.0–36.0)
MCV: 89.4 fL (ref 80.0–100.0)
Platelets: 313 10*3/uL (ref 150–400)
RBC: 3.78 MIL/uL — ABNORMAL LOW (ref 3.87–5.11)
RDW: 14 % (ref 11.5–15.5)
WBC: 11.7 10*3/uL — ABNORMAL HIGH (ref 4.0–10.5)
nRBC: 0 % (ref 0.0–0.2)

## 2018-06-20 LAB — I-STAT TROPONIN, ED
Troponin i, poc: 0 ng/mL (ref 0.00–0.08)
Troponin i, poc: 0 ng/mL (ref 0.00–0.08)

## 2018-06-20 LAB — I-STAT BETA HCG BLOOD, ED (MC, WL, AP ONLY): I-stat hCG, quantitative: <5 m[IU]/mL (ref <5)

## 2018-06-20 LAB — ECHOCARDIOGRAM LIMITED
Height: 66 in
Weight: 2848 oz

## 2018-06-20 MED ORDER — IOPAMIDOL (ISOVUE-370) INJECTION 76%
INTRAVENOUS | Status: AC
  Filled 2018-06-20: qty 100

## 2018-06-20 MED ORDER — COLCHICINE 0.6 MG PO TABS: 0.6000 mg | ORAL_TABLET | Freq: Two times a day (BID) | ORAL | 0 refills | Status: DC

## 2018-06-20 MED ORDER — IBUPROFEN 600 MG PO TABS: 600.0000 mg | ORAL_TABLET | Freq: Three times a day (TID) | ORAL | 0 refills | Status: DC

## 2018-06-20 MED ORDER — IOPAMIDOL (ISOVUE-370) INJECTION 76%
100.0000 mL | Freq: Once | INTRAVENOUS | Status: AC | PRN
  Administered 2018-06-20: 100 mL via INTRAVENOUS

## 2018-06-20 NOTE — ED Notes (Signed)
Pt updated that she has only 1 person in front of her for CT. Appreciative.

## 2018-06-20 NOTE — ED Notes (Signed)
Per MD, cards called for follow up on echo.

## 2018-06-20 NOTE — ED Notes (Signed)
Pt reports having mitral valve surgery recently and then yesterday she started to have palpitations.

## 2018-06-20 NOTE — ED Notes (Signed)
Pt updated on POC- no distress noted

## 2018-06-20 NOTE — ED Provider Notes (Addendum)
7 25 a.m. seen by me patient resting comfortably.  Asymptomatic she reports 2 episodes of chest pain, left-sided.  First episode yesterday afternoon lasted 5 to 10 minutes. second episode started approximately 8 PM yesterday and lasted until around midnight today.  She has been asymptomatic since.  On exam no distress lungs clear to auscultation heart regular rate and rhythm no murmurs   Orlie Dakin, MD 06/20/18 0734 4 PM patient remains asymptomatic.  Dr. Theodosia Blender note appreciated.  Plan prescriptions ibuprofen, colchicine.  Follow-up Dr. Tamala Julian 2 weeks in office of note strongly doubt pneumonia clinically.  Patient has no cough no fever.  Lungs clear to auscultation no shortness of breath Results for orders placed or performed during the hospital encounter of 84/13/24  Basic metabolic panel  Result Value Ref Range   Sodium 138 135 - 145 mmol/L   Potassium 3.5 3.5 - 5.1 mmol/L   Chloride 99 98 - 111 mmol/L   CO2 26 22 - 32 mmol/L   Glucose, Bld 165 (H) 70 - 99 mg/dL   BUN 20 6 - 20 mg/dL   Creatinine, Ser 1.00 0.44 - 1.00 mg/dL   Calcium 9.4 8.9 - 10.3 mg/dL   GFR calc non Af Amer >60 >60 mL/min   GFR calc Af Amer >60 >60 mL/min   Anion gap 13 5 - 15  CBC  Result Value Ref Range   WBC 11.7 (H) 4.0 - 10.5 K/uL   RBC 3.78 (L) 3.87 - 5.11 MIL/uL   Hemoglobin 10.6 (L) 12.0 - 15.0 g/dL   HCT 33.8 (L) 36.0 - 46.0 %   MCV 89.4 80.0 - 100.0 fL   MCH 28.0 26.0 - 34.0 pg   MCHC 31.4 30.0 - 36.0 g/dL   RDW 14.0 11.5 - 15.5 %   Platelets 313 150 - 400 K/uL   nRBC 0.0 0.0 - 0.2 %  I-stat troponin, ED  Result Value Ref Range   Troponin i, poc 0.00 0.00 - 0.08 ng/mL   Comment 3          I-Stat beta hCG blood, ED  Result Value Ref Range   I-stat hCG, quantitative <5.0 <5 mIU/mL   Comment 3          I-stat troponin, ED  Result Value Ref Range   Troponin i, poc 0.00 0.00 - 0.08 ng/mL   Comment 3          ECHOCARDIOGRAM LIMITED  Result Value Ref Range   Weight 2,848 oz   Height 66 in    BP 116/68 mmHg   Dg Chest 2 View  Result Date: 06/20/2018 CLINICAL DATA:  Acute onset of left chest discomfort, radiating to the left arm. EXAM: CHEST - 2 VIEW COMPARISON:  Chest radiograph performed 04/24/2018 FINDINGS: The lungs are well-aerated. Mild left basilar airspace opacity likely reflects atelectasis. There is no evidence of pleural effusion or pneumothorax. The heart is borderline enlarged. No acute osseous abnormalities are seen. IMPRESSION: Mild left basilar airspace opacity likely reflects atelectasis. Borderline cardiomegaly. Electronically Signed   By: Garald Balding M.D.   On: 06/20/2018 00:59   Ct Angio Chest Pe W Or Wo Contrast  Result Date: 06/20/2018 CLINICAL DATA:  Two recent episodes of chest pain left-sided EXAM: CT ANGIOGRAPHY CHEST WITH CONTRAST TECHNIQUE: Multidetector CT imaging of the chest was performed using the standard protocol during bolus administration of intravenous contrast. Multiplanar CT image reconstructions and MIPs were obtained to evaluate the vascular anatomy. CONTRAST:  169mL ISOVUE-370 IOPAMIDOL (ISOVUE-370)  INJECTION 76% COMPARISON:  Chest x-ray of 06/20/2017 and CT chest of 03/30/2017 FINDINGS: Cardiovascular: The pulmonary arteries are well opacified. There is no evidence of pulmonary embolism. The thoracic aorta is not as well opacified but no acute abnormality is seen. The mid ascending thoracic aorta measures 29 mm in diameter which is normal. The heart is mildly enlarged and very little pericardial effusion is seen. Mediastinum/Nodes: No mediastinal or hilar adenopathy is noted. The thyroid gland is unremarkable. No hiatal hernia is seen. Lungs/Pleura: On lung window images, no lung mass or nodule is seen. There is a small right pleural effusion now present some of which extends into the major fissure. Also there are prominent and coarse markings in both lower lobes posteriorly right greater than left. This is more than is generally seen with  atelectasis and developing bibasilar pneumonia is a definite consideration right greater than left. Follow-up two-view chest x-ray is recommended. The central airway is patent. Upper Abdomen: The liver enhances with no focal abnormality and no ductal dilatation is seen. No abnormality of the upper abdomen is noted on the few images obtained through the upper abdomen. Musculoskeletal: The thoracic vertebrae are in normal alignment with minimal degenerative change. No rib abnormality is seen. Review of the MIP images confirms the above findings. IMPRESSION: 1. No evidence of pulmonary embolism. 2. However there are coarse and prominent markings in both lower lobes posteriorly with a small right pleural effusion, findings most consistent with developing pneumonia within both lower lobes. Recommend follow up by two-view chest x-ray. Electronically Signed   By: Ivar Drape M.D.   On: 06/20/2018 15:04     Orlie Dakin, MD 06/20/18 1558

## 2018-06-20 NOTE — ED Triage Notes (Signed)
Pt reports having mitral valve repair on 04/07/18 and states that she went work for the first time since today and was having discomfort in her left chest radiating to her left arm, denies SOB or dizziness. States that when she leans back in her chair it feels like "her heart is coming through to her throat"

## 2018-06-20 NOTE — Telephone Encounter (Signed)
Received page from patient's husband.  Patient has been doing well since her tricuspid and mitral valve surgery about 10 weeks ago.  Earlier today she developed the onset of substernal chest pain.  Since this is a new acute complaint, I advised that the patient presented to the emergency department for evaluation.  The patient's husband is in agreement with this plan and they will proceed to the ED this evening.

## 2018-06-20 NOTE — ED Notes (Signed)
Pt back from ECHO. EDP informed

## 2018-06-20 NOTE — ED Provider Notes (Signed)
[facility] EMERGENCY DEPARTMENT Provider Note   CSN: [id number] Arrival date & time: 06/20/18  0025     History   Chief Complaint Chief Complaint  Patient presents with  . Chest Pain    HPI Ariana Ramirez is a 57 y.o. female.  The history is provided by the patient.  Chest Pain   This is a new problem. The current episode started 6 to 12 hours ago. The problem occurs constantly. The problem has been gradually improving. Radiates to: Neck and left arm. Pertinent negatives include no diaphoresis, no shortness of breath, no syncope and no vomiting. She has tried nothing for the symptoms.  Patient with history of diabetes, hyperlipidemia, pulmonary hypertension.  She underwent a recent surgery to remove a tricuspid valve mass in October She has done well postoperatively.  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  During her shift she began having chest dullness.  This lasted for several hours, and then she began having palpitations and the feeling going into her neck and her left arm.  No syncope.  Her symptoms are now improving.  She does not recall history of CAD. Past Medical History:  Diagnosis Date  . Diabetes mellitus   . GERD (gastroesophageal reflux disease)   . Hyperlipidemia   . Pulmonary hypertension ([place])   . 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 Tricuspid valve mass   . Chronic cough 02/10/2018  . GERD (gastroesophageal reflux disease) 02/10/2018  . Pulmonary hypertension ([place]) 02/10/2018  . Diabetes ([place]) 02/21/2014    Past Surgical History:  Procedure Laterality Date  . APPENDECTOMY    . CESAREAN SECTION    . ECTOPIC PREGNANCY SURGERY    . MINIMALLY INVASIVE EXCISION OF ATRIAL MYXOMA N/A 04/07/2018   Procedure: MINIMALLY INVASIVE  RESECTION OF TRICUSPID VALVE MASS;  Surgeon: Rexene Alberts, MD;  Location: [facility];  Service: Open Heart Surgery;  Laterality: N/A;  . TEE WITHOUT CARDIOVERSION N/A 02/28/2018   Procedure: TRANSESOPHAGEAL ECHOCARDIOGRAM (TEE);  Surgeon: Sueanne Margarita, MD;  Location[facility] ENDOSCOPY;  Service: Cardiovascular;  Laterality: N/A;  . TEE WITHOUT CARDIOVERSION N/A 04/07/2018   Procedure: TRANSESOPHAGEAL ECHOCARDIOGRAM (TEE);  Surgeon: Rexene Alberts, MD;  Location: [place];  Service: Open Heart Surgery;  Laterality: N/A;  . TUBAL LIGATION       OB History   No obstetric history on file.      Home Medications    Prior to Admission medications   Medication Sig Start Date End Date Taking? Authorizing Provider  fluticasone (FLONASE) 50 MCG/ACT nasal spray Place 2 sprays into both nostrils daily as needed for allergies.    [provider]  JANUMET XR [phone] MG TB24 Take 1 tablet by mouth daily. 02/17/18   [provider]  Multiple Vitamin (MULTIVITAMIN WITH MINERALS) TABS tablet Take 1 tablet by mouth every other day.     [provider]  pantoprazole (PROTONIX) 40 MG tablet Take 40 mg by mouth 2 (two) times daily.    [provider]  simvastatin (ZOCOR) 40 MG tablet Take 40 mg by mouth every evening.    [provider]  triamterene-hydrochlorothiazide (MAXZIDE-25) 37.5-25 MG per tablet Take 1 tablet by mouth daily.    [provider]    Family History Family History  Problem Relation  Age of Onset  . Stroke Father   . Hypertension Paternal Grandmother   . Hypertension Other   . Hypertension Mother     Social History Social History   Tobacco Use  . Smoking status: Never Smoker  . Smokeless tobacco: Never Used  Substance Use Topics  . Alcohol use: No  . Drug use: No     Allergies   Aspirin; Hydrocodone; Percocet [oxycodone-acetaminophen]; Aleve [naproxen sodium]; Food; and Pork-derived products   Review of Systems Review of  Systems  Constitutional: Negative for diaphoresis.  Respiratory: Negative for shortness of breath.   Cardiovascular: Positive for chest pain. Negative for syncope.  Gastrointestinal: Negative for vomiting.  Neurological: Negative for syncope.  All other systems reviewed and are negative.    Physical Exam Updated Vital Signs BP 114/74 (BP Location: Left Arm)   Pulse (!) 101   Temp 98.8 F (37.1 C) (Oral)   Resp 18   Ht 1.676 m (5\' 6" )   Wt 80.7 kg   SpO2 98%   BMI 28.73 kg/m   Physical Exam CONSTITUTIONAL: Well developed/well nourished HEAD: Normocephalic/atraumatic EYES: EOMI/PERRL ENMT: Mucous membranes moist NECK: supple no meningeal signs SPINE/BACK:entire spine nontender CV: S1/S2 noted, no loud harsh murmurs LUNGS: Lungs are clear to auscultation bilaterally, no apparent distress ABDOMEN: soft, nontender, no rebound or guarding, bowel sounds noted throughout abdomen GU:no cva tenderness NEURO: Pt is awake/alert/appropriate, moves all extremitiesx4.  No facial droop.   EXTREMITIES: pulses normal/equal, full ROM SKIN: warm, color normal PSYCH: no abnormalities of mood noted, alert and oriented to situation   ED Treatments / Results  Labs (all labs ordered are listed, but only abnormal results are displayed) Labs Reviewed  BASIC METABOLIC PANEL - Abnormal; Notable for the following components:      Result Value   Glucose, Bld 165 (*)    All other components within normal limits  CBC - Abnormal; Notable for the following components:   WBC 11.7 (*)    RBC 3.78 (*)    Hemoglobin 10.6 (*)    HCT 33.8 (*)    All other components within normal limits  I-STAT TROPONIN, ED  I-STAT BETA HCG BLOOD, ED ([facility], [facility], [facility] ONLY)  I-STAT TROPONIN, ED    EKG EKG Interpretation  Date/Time:  Tuesday June 20 2018 00:32:32 EST Ventricular Rate:  115 PR Interval:  160 QRS Duration: 86 QT Interval:  344 QTC Calculation: 475 R Axis:   18 Text Interpretation:  Sinus  tachycardia Otherwise normal ECG Abnormal ekg Confirmed by Ripley Fraise [phone]) on 06/20/2018 12:37:21 AM   EKG Interpretation  Date/Time:  Tuesday June 20 2018 04:27:10 EST Ventricular Rate:  94 PR Interval:  160 QRS Duration: 96 QT Interval:  370 QTC Calculation: 463 R Axis:   10 Text Interpretation:  Sinus rhythm when compared to EKG from october, no significant change Confirmed by Ripley Fraise [phone]) on 06/20/2018 4:33:23 AM        Radiology Dg Chest 2 View  Result Date: 06/20/2018 CLINICAL DATA:  Acute onset of left chest discomfort, radiating to the left arm. EXAM: CHEST - 2 VIEW COMPARISON:  Chest radiograph performed 04/24/2018 FINDINGS: The lungs are well-aerated. Mild left basilar airspace opacity likely reflects atelectasis. There is no evidence of pleural effusion or pneumothorax. The heart is borderline enlarged. No acute osseous abnormalities are seen. IMPRESSION: Mild left basilar airspace opacity likely reflects atelectasis. Borderline cardiomegaly. Electronically Signed   By: Garald Balding M.D.   On: 06/20/2018 00:59  Procedures Procedures    Medications Ordered in ED Medications - No data to display   Initial Impression / Assessment and Plan / ED Course  I have reviewed the triage vital signs and the nursing notes.  Pertinent labs & imaging results that were available during my care of the patient were reviewed by me and considered in my medical decision making (see chart for details).     4:33 AM Patient underwent minimally invasive tricuspid valve mass removal in October.  She has done well, until about a work she began having chest pain and what appears to be palpitations. Discussed the case with cardiology fellow to evaluate patient 5:45 AM I discussed the case with Dr. Dickens Lions from cardiology He has seen the patient.  Patient's pain complaints are various including some pleuritic and positional pain.  Postoperative pericarditis is  a possibility given EKG findings.  He requests repeat troponin, and he will order limited bedside echocardiogram.  If this is negative she can be discharged.  He will have the day team from cardiology see patient    6:57 AM Pt stable Signed out to dr Winfred Leeds to f/u on echo and cardiology recommendations Final Clinical Impressions(s) / ED Diagnoses   Final diagnoses:  Precordial pain  Post-operative pain  Palpitations    ED Discharge Orders    None       Ripley Fraise, MD 06/20/18 [phone]

## 2018-06-20 NOTE — Progress Notes (Addendum)
Patient continues to have episodic pleuritic chest pain.  It only occurs with deep breathing and does not occur at rest.  Troponin is 02.    GEN: Well nourished, well developed in no acute distress HEENT: Normal NECK: No JVD; No carotid bruits LYMPHATICS: No lymphadenopathy CARDIAC:RRR, no murmurs, rubs, gallops RESPIRATORY:  Clear to auscultation without rales, wheezing or rhonchi  ABDOMEN: Soft, non-tender, non-distended MUSCULOSKELETAL:  Trivial LE edema; No deformity  SKIN: Warm and dry NEUROLOGIC:  Alert and oriented x 3 PSYCHIATRIC:  Normal affect    2D echo shows normal LV function with EF 60 to 65% with no regional wall motion abnormalities.  There was moderate TR.  There was no evidence of cardial effusion.  Chest x-ray shows mild left basilar airspace opacity likely atelectasis.  She does have decreased breath sounds at the right base.  We will get a chest CT angios to rule out PE.    I did not think this is an acute coronary syndrome as her symptoms are not consistent with coronary ischemia and troponin is normal x2,  Coronary CTA in October 2019 showed a calcium score of 0 and no evidence of CAD.  Chest CT shows no evidence of PE then will treat this as acute pericarditis and start Motrin 600 mg every 8 hours x2 weeks and colchicine 0.6 mg twice daily for 3 months.  I will send off and ESR and CRP today.  She should be okay for discharge home if CT scan shows no PE.

## 2018-06-20 NOTE — Consult Note (Signed)
Cardiology Consultation Note    Patient ID: Ariana Ramirez, MRN: [id number], DOB/AGE: 55-May-1962 58 y.o. Admit date: 06/20/2018   Date of Consult: 06/20/2018 Primary Physician: Haywood Pao, MD Primary Cardiologist: Dr. Tamala Julian  Reason for Consultation: Chest pain Requesting MD: Dr. Christy Gentles  HPI: Ariana Ramirez is a 57 y.o. 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  The patient had been doing very well postoperatively and has been followed up regularly in the office.  She had an echocardiogram last week that showed normal LV function with mild antero septal hypokinesis, mild TR and no other significant abnormalities.  Today was her first day back at work.  She had an episode of substernal chest discomfort after taking a long walk.  This subsequently resolved with rest.  She was okay for the remainder of the day at work, but when she got back home she had an episode of chest pressure that lasted 15 or 20 minutes, followed by very significant pleuritic chest pain.  There is also a positional component to this latter episode of chest pain, and was significantly better when in an upright position and exacerbated by laying back.  She continued to have the discomfort until presenting to the emergency department, at which point it abated without any intervention.  Upon presentation she was mildly tachycardic to the 110s, with the remainder of her vital signs are within normal limits.  Initial EKG showed sinus tachycardia, with mild ST elevation in 1, aVL, V2 and V3.  There is also PR elevation in aVR and PR depression in the inferior leads.  Her labs are notable for a mildly elevated white count 11.7, hemoglobin of 10.6 (stable), and normal basic metabolic panel.  She had one point-of-care troponin which was detectable.  Upon my interview, the patient is chest pain-free.  Past Medical  History:  Diagnosis Date  . Diabetes mellitus   . GERD (gastroesophageal reflux disease)   . Hyperlipidemia   . Pulmonary hypertension ([place])   . 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 APPENDECTOMY    . CESAREAN SECTION    . ECTOPIC PREGNANCY SURGERY    . MINIMALLY INVASIVE EXCISION OF ATRIAL MYXOMA N/A 04/07/2018   Procedure: MINIMALLY INVASIVE RESECTION OF TRICUSPID VALVE MASS;  Surgeon: Rexene Alberts, MD;  Location: [place];  Service: Open Heart Surgery;  Laterality: N/A;  . TEE WITHOUT CARDIOVERSION N/A 02/28/2018   Procedure: TRANSESOPHAGEAL ECHOCARDIOGRAM (TEE);  Surgeon: Sueanne Margarita, MD;  Location: [facility] ENDOSCOPY;  Service: Cardiovascular;  Laterality: N/A;  . TEE WITHOUT CARDIOVERSION N/A 04/07/2018   Procedure: TRANSESOPHAGEAL ECHOCARDIOGRAM (TEE);  Surgeon: Rexene Alberts, MD;  Location: [place];  Service: Open Heart Surgery;  Laterality: N/A;  . TUBAL LIGATION       Home Meds: Prior to Admission medications   Medication Sig Start Date End Date Taking? Authorizing Provider  fluticasone (FLONASE) 50 MCG/ACT nasal spray Place 2 sprays into both nostrils daily as needed for allergies.   Yes [provider]  JANUMET XR [phone] MG TB24 Take 1 tablet by mouth daily. 02/17/18  Yes [provider]  Multiple Vitamin (MULTIVITAMIN WITH MINERALS) TABS tablet Take 1 tablet by mouth every other day.    Yes [provider]  pantoprazole (PROTONIX) 40 MG tablet Take 40  mg by mouth 2 (two) times daily.   Yes [provider]  simvastatin (ZOCOR) 40 MG tablet Take 40 mg by mouth every evening.   Yes [provider]  triamterene-hydrochlorothiazide (MAXZIDE-25) 37.5-25 MG per tablet Take 1 tablet by mouth daily.   Yes [provider]    Inpatient Medications:     Allergies:  Allergies  Allergen  Reactions  . Aspirin Other (See Comments)    GI intolerance  . Hydrocodone Hives    Pt states severe hives   . Percocet [Oxycodone-Acetaminophen] Nausea And Vomiting    Extreme vomiting  . Aleve [Naproxen Sodium] Hives  . Food Rash    Fresh apples, pears, peaches and nectarines cause rash on mouth and in throat (able to tolerate canned fruit)  . Pork-Derived Products Other (See Comments)    Personal preference - patient ok with medications; she prefers not to eat pork products    Social History   Socioeconomic History  . Marital status: Married    Spouse name: Not on file  . Number of children: Not on file  . Years of education: Not on file  . Highest education level: Not on file  Occupational History  . Not on file  Social Needs  . Financial resource strain: Not on file  . Food insecurity:    Worry: Not on file    Inability: Not on file  . Transportation needs:    Medical: Not on file    Non-medical: Not on file  Tobacco Use  . Smoking status: Never Smoker  . Smokeless tobacco: Never Used  Substance and Sexual Activity  . Alcohol use: No  . Drug use: No  . Sexual activity: Not on file  Lifestyle  . Physical activity:    Days per week: Not on file    Minutes per session: Not on file  . Stress: Not on file  Relationships  . Social connections:    Talks on phone: Not on file    Gets together: Not on file    Attends religious service: Not on file    Active member of club or organization: Not on file    Attends meetings of clubs or organizations: Not on file    Relationship status: Not on file  . Intimate partner violence:    Fear of current or ex partner: Not on file    Emotionally abused: Not on file    Physically abused: Not on file    Forced sexual activity: Not on file  Other Topics Concern  . Not on file  Social History Narrative  . Not on file     Family History  Problem Relation Age of Onset  . Stroke Father   . Hypertension Paternal Grandmother     . Hypertension Other   . Hypertension Mother      Review of Systems: All other systems reviewed and are otherwise negative except as noted above.  Labs: No results for input(s): CKTOTAL, CKMB, TROPONINI in the last 72 hours. Lab Results  Component Value Date   WBC 11.7 (H) 06/20/2018   HGB 10.6 (L) 06/20/2018   HCT 33.8 (L) 06/20/2018   MCV 89.4 06/20/2018   PLT 313 06/20/2018    Recent Labs  Lab 06/20/18 0042  NA 138  K 3.5  CL 99  CO2 26  BUN 20  CREATININE 1.00  CALCIUM 9.4  GLUCOSE 165*   No results found for: CHOL, HDL, LDLCALC, TRIG No results found for: DDIMER  Radiology/Studies:  Dg Chest 2 View  Result Date: 06/20/2018 CLINICAL DATA:  Acute onset of left chest discomfort, radiating to the left arm. EXAM: CHEST - 2 VIEW COMPARISON:  Chest radiograph performed 04/24/2018 FINDINGS: The lungs are well-aerated. Mild left basilar airspace opacity likely reflects atelectasis. There is no evidence of pleural effusion or pneumothorax. The heart is borderline enlarged. No acute osseous abnormalities are seen. IMPRESSION: Mild left basilar airspace opacity likely reflects atelectasis. Borderline cardiomegaly. Electronically Signed   By: Garald Balding M.D.   On: 06/20/2018 00:59    Wt Readings from Last 3 Encounters:  06/20/18 80.7 kg  06/12/18 80.7 kg  05/16/18 80.3 kg    EKG: Sinus tachycardia, submillimeter ST elevation in I, aVL, V2, V3, PR elevation in aVR, PR depression in lead II.  Physical Exam: Blood pressure 117/64, pulse 97, temperature 98.8 F (37.1 C), temperature source Oral, resp. rate (!) 23, height 5\' 6"  (1.676 m), weight 80.7 kg, SpO2 100 %. Body mass index is 28.73 kg/m. General: Well developed, well nourished, in no acute distress. Head: Normocephalic, atraumatic, sclera non-icteric, no xanthomas, nares are without discharge.  Neck: Negative for carotid bruits. JVP 10. Lungs: Clear bilaterally to auscultation without wheezes, rales, or  rhonchi. Breathing is unlabored. Heart: RRR with S1 S2. No murmurs, rubs, or gallops appreciated. Abdomen: Soft, non-tender, non-distended with normoactive bowel sounds. No hepatomegaly. No rebound/guarding. No obvious abdominal masses. Msk:  Strength and tone appear normal for age. Extremities: No clubbing or cyanosis. No edema.  Distal pedal pulses are 2+ and equal bilaterally. Neuro: Alert and oriented X 3. No facial asymmetry. No focal deficit. Moves all extremities spontaneously. Psych:  Responds to questions appropriately with a normal affect.     Assessment and Plan  57 year old lady with a recent minimally invasive tricuspid valve PFE excision presents with chest pain.  While the time course of her symptoms not typical for a pericardial process, she did have a significant episode of pleuritic, positional chest pain, and does have some ECG findings that are consistent with pericarditis.  I do not see any evidence of cardiac catheterization prior to surgery, although a coronary etiology for chest pain seems less likely.  Would plan to repeat 1 more set of cardiac enzymes.  Although she had a reassuring echo last week, given her new symptoms and ECG findings consistent with possible pericarditis would plan to repeat a limited TTE while in the ED.  If all the above look reassuring she could be discharged home.  She were to have subsequent episodes of chest pain would consider monitoring for possible arrhythmia on an outpatient basis.  Signed, Doylene Canning, MD 06/20/2018, 5:28 AM

## 2018-06-20 NOTE — Discharge Instructions (Addendum)
The medication as prescribed.  Call Dr. Thompson Caul office tomorrow to arrange to be seen within the next 2 weeks.  Return if your condition worsens for any reason

## 2018-06-20 NOTE — Progress Notes (Signed)
2D Echocardiogram has been performed.  Ariana Ramirez 06/20/2018, 8:21 AM

## 2018-06-26 ENCOUNTER — Ambulatory Visit: Payer: 59 | Admitting: Thoracic Surgery (Cardiothoracic Vascular Surgery)

## 2018-06-27 MED FILL — JANUMET XR 100-1,000 MG TAB: 100-1000 | 30 days supply | Qty: 30 | Fill #2

## 2018-07-06 ENCOUNTER — Other Ambulatory Visit: Payer: Self-pay

## 2018-07-06 ENCOUNTER — Encounter: Payer: Self-pay | Admitting: Thoracic Surgery (Cardiothoracic Vascular Surgery)

## 2018-07-06 ENCOUNTER — Ambulatory Visit (INDEPENDENT_AMBULATORY_CARE_PROVIDER_SITE_OTHER): Payer: Self-pay | Admitting: Thoracic Surgery (Cardiothoracic Vascular Surgery)

## 2018-07-06 VITALS — BP 130/79 | HR 86 | Resp 18 | Ht 66.0 in | Wt 183.0 lb

## 2018-07-06 DIAGNOSIS — D151 Benign neoplasm of heart: Secondary | ICD-10-CM

## 2018-07-06 NOTE — Patient Instructions (Addendum)
Continue all previous medications without any changes at this time  Consider stopping colchicine at some point  You may resume unrestricted physical activity without any particular limitations at this time.

## 2018-07-06 NOTE — Progress Notes (Signed)
[address]       [place] [address]             [phone]     CARDIOTHORACIC SURGERY OFFICE NOTE  Referring Provider is Belva Crome, MD PCP is Tisovec, Fransico Him, MD   HPI:  Patient is a 58 year old female who returns to the office today for routine follow-up status post minimally invasive resection of papillary fibro-elastoma from the tricuspid valve on April 07, 2018.  Her early postoperative recovery was uneventful and she was last seen here in our office on April 24, 2018 at which time she was doing well.  She was seen in follow-up by Truitt Merle at [facility] on May 16, 2018 at which time she was doing fairly well.  Her beta-blocker was stopped at that time because of a rash that was felt potentially related to new medication.  Several weeks ago she went back to work for the first time, and that evening she developed substernal chest discomfort for which she presented to the emergency department.  She underwent a fairly comprehensive evaluation in the emergency department including CT angiogram of the chest, EKG, blood work, and echocardiogram.  White blood count was 11,700 and hemoglobin 10.6.  All other lab work were normal.  Troponins were negative.  EKGs revealed sinus rhythm with heart rate 90-100.  There was no evidence for pulmonary embolus.  There was trivial right pleural effusion and mild right lower lobe opacity consistent with atelectasis versus pneumonia.  Echocardiogram was notable for the absence of any pericardial effusion.  There was normal left and right ventricular function.  There was moderate tricuspid regurgitation.  No other abnormalities were noted.  The patient was started on oral colchicine for possible pericarditis.  She has not had any further episodes of chest discomfort.  She returns her office today and reports that she is doing well.  She is back at work 3 days a week.  She no longer has any significant chest pain.  She  has mild numbness along her surgical incision.  She does have some mild pain and numbness along the medial aspect of the right thigh consistent with likely neuropathic pain from the medial femoral cutaneous nerve as it runs through the groin.  She has not had any swelling in the groin.  She denies any fevers or chills.  Overall she feels quite well.    Current Outpatient Medications  Medication Sig Dispense Refill  . colchicine 0.6 MG tablet Take 1 tablet (0.6 mg total) by mouth 2 (two) times daily. 180 tablet 0  . fluticasone (FLONASE) 50 MCG/ACT nasal spray Place 2 sprays into both nostrils daily as needed for allergies.    Marland Kitchen ibuprofen (ADVIL,MOTRIN) 600 MG tablet Take 1 tablet (600 mg total) by mouth 3 (three) times daily. 42 tablet 0  . JANUMET XR [phone] MG TB24 Take 1 tablet by mouth daily.  3  . Multiple Vitamin (MULTIVITAMIN WITH MINERALS) TABS tablet Take 1 tablet by mouth every other day.     . pantoprazole (PROTONIX) 40 MG tablet Take 40 mg by mouth 2 (two) times daily.    Marland Kitchen triamterene-hydrochlorothiazide (MAXZIDE-25) 37.5-25 MG per tablet Take 1 tablet by mouth daily.    . simvastatin (ZOCOR) 40 MG tablet Take 40 mg by mouth every evening.     No current facility-administered medications for this visit.       Physical Exam:   BP 130/79 (BP Location: Right  Arm, Patient Position: Sitting, Cuff Size: Normal)   Pulse 86   Resp 18   Ht 5\' 6"  (1.676 m)   Wt 183 lb (83 kg)   SpO2 98% Comment: RA  BMI 29.54 kg/m   General:  Well-appearing  Chest:   Clear to auscultation  CV:   Regular rate and rhythm without murmur  Incisions:  Completely healed  Abdomen:  Soft nontender  Extremities:  Warm and well-perfused, no lower extremity edema  Diagnostic Tests:  Transthoracic Echocardiography  Patient:    Ariana, Ramirez MR #:       [id number] Study Date: 06/20/2018 Gender:     F Age:        68 Height:     167.6 cm Weight:     80.7 kg BSA:        1.96 m^2 Pt.  Status: Room:   Pulcifer, [place]  SONOGRAPHER  Dustin Flock, RCS  PERFORMING   [facility], Inpatient  ORDERING     Chakravartti, Jaidip  REFERRING    Chakravartti, Jaidip  cc:  ------------------------------------------------------------------- LV EF: 60% -   65%  ------------------------------------------------------------------- Indications:      Chest pain 786.51.  ------------------------------------------------------------------- History:   PMH:  Pul.HTN, s/p resection of TV mass10/19.  Risk factors:  Diabetes mellitus. Dyslipidemia.  ------------------------------------------------------------------- Study Conclusions  - Left ventricle: The cavity size was normal. Systolic function was   normal. The estimated ejection fraction was in the range of 60%   to 65%. Wall motion was normal; there were no regional wall   motion abnormalities. Left ventricular diastolic function   parameters were normal. - Tricuspid valve: There was moderate regurgitation.  ------------------------------------------------------------------- Study data:  Comparison was made to the study of 06/12/2018.  Study status:  Routine.  Procedure:  The patient reported no pain pre or post test. Transthoracic echocardiography. Image quality was adequate.  Study completion:  There were no complications. Transthoracic echocardiography.  M-mode, limited 2D, limited spectral Doppler, and color Doppler.  Birthdate:  Patient birthdate: [birth year].  Age:  Patient is 58 yr old.  Sex:  Gender: female.    BMI: 28.7 kg/m^2.  Blood pressure:     132/74  Patient status:  Inpatient.  Study date:  Study date: 06/20/2018. Study time: 07:55 AM.  Location:  Echo laboratory.  -------------------------------------------------------------------  ------------------------------------------------------------------- Left ventricle:  The cavity size was normal. Systolic function was normal. The  estimated ejection fraction was in the range of 60% to 65%. Wall motion was normal; there were no regional wall motion abnormalities. The transmitral flow pattern was normal. The deceleration time of the early transmitral flow velocity was normal. The pulmonary vein flow pattern was normal. The tissue Doppler parameters were normal. Left ventricular diastolic function parameters were normal.  ------------------------------------------------------------------- Aortic valve:   Trileaflet; normal thickness leaflets. Mobility was not restricted.  Doppler:  Transvalvular velocity was within the normal range. There was no stenosis. There was no regurgitation.   ------------------------------------------------------------------- Aorta:  Aortic root: The aortic root was normal in size.  ------------------------------------------------------------------- Mitral valve:   Structurally normal valve.   Mobility was not restricted.  Doppler:  Transvalvular velocity was within the normal range. There was no evidence for stenosis. There was trivial regurgitation.  ------------------------------------------------------------------- Left atrium:  The atrium was normal in size.  ------------------------------------------------------------------- Right ventricle:  The cavity size was normal. Wall thickness was normal. Systolic function was normal.  ------------------------------------------------------------------- Pulmonic valve:   Poorly visualized.  The valve appears to be  grossly normal.    Doppler:  Transvalvular velocity was within the normal range. There was no evidence for stenosis.  ------------------------------------------------------------------- Tricuspid valve:  Poorly visualized.  Structurally normal valve. Doppler:  Transvalvular velocity was within the normal range. There was moderate regurgitation.    Mean gradient (D): 1 mm  Hg.  ------------------------------------------------------------------- Pulmonary artery:   The main pulmonary artery was normal-sized. Systolic pressure was at the upper limits of normal.  ------------------------------------------------------------------- Right atrium:  The atrium was normal in size.  ------------------------------------------------------------------- Pericardium:  There was no pericardial effusion.  ------------------------------------------------------------------- Systemic veins: Inferior vena cava: The vessel was normal in size. The respirophasic diameter changes were in the normal range (>= 50%), consistent with normal central venous pressure.  ------------------------------------------------------------------- Measurements   Left ventricle                              Value        Reference  LV ID, ED, PLAX chordal             (L)     40     mm    43 - 52  LV ID, ES, PLAX chordal                     29     mm    23 - 38  LV fx shortening, PLAX chordal      (L)     28     %     >=29  LV PW thickness, ED                         9.2    mm    ---------  IVS/LV PW ratio, ED                         1.19         <=1.3  LV ejection fraction, 1-p A4C               55     %     ---------  LV e&', lateral                              11.2   cm/s  ---------  LV e&', medial                               10.3   cm/s  ---------  LV e&', average                              10.75  cm/s  ---------    Ventricular septum                          Value        Reference  IVS thickness, ED                           10.93  mm    ---------    Pulmonary arteries  Value        Reference  PA pressure, S, DP                          30     mm Hg <=30    Tricuspid valve                             Value        Reference  Tricuspid mean velocity, D                  51.21  cm/s  ---------  Tricuspid mean gradient, D                  1      mm Hg  ---------  Tricuspid VTI at annulus, D                 133.4  mm    ---------  Tricuspid regurg peak velocity              258.21 cm/s  ---------  Tricuspid peak RV-RA gradient               27     mm Hg ---------  Tricuspid maximal regurg velocity,          258.21 cm/s  ---------  PISA    Systemic veins                              Value        Reference  Estimated CVP                               3      mm Hg ---------    Right ventricle                             Value        Reference  RV pressure, S, DP                          30     mm Hg <=30  Legend: (L)  and  (H)  mark values outside specified reference range.  ------------------------------------------------------------------- Prepared and Electronically Authenticated by  Sanda Klein, MD 2019-12-31T12:15:22    CT ANGIOGRAPHY CHEST WITH CONTRAST  TECHNIQUE: Multidetector CT imaging of the chest was performed using the standard protocol during bolus administration of intravenous contrast. Multiplanar CT image reconstructions and MIPs were obtained to evaluate the vascular anatomy.  CONTRAST:  122mL ISOVUE-370 IOPAMIDOL (ISOVUE-370) INJECTION 76%  COMPARISON:  Chest x-ray of 06/20/2017 and CT chest of 03/30/2017  FINDINGS: Cardiovascular: The pulmonary arteries are well opacified. There is no evidence of pulmonary embolism. The thoracic aorta is not as well opacified but no acute abnormality is seen. The mid ascending thoracic aorta measures 29 mm in diameter which is normal. The heart is mildly enlarged and very little pericardial effusion is seen.  Mediastinum/Nodes: No mediastinal or hilar adenopathy is noted. The thyroid gland is unremarkable. No hiatal hernia is seen.  Lungs/Pleura: On lung window images, no lung mass or nodule is seen. There is a small right pleural effusion now present some of which extends into  the major fissure. Also there are prominent and coarse markings in both lower  lobes posteriorly right greater than left. This is more than is generally seen with atelectasis and developing bibasilar pneumonia is a definite consideration right greater than left. Follow-up two-view chest x-ray is recommended. The central airway is patent.  Upper Abdomen: The liver enhances with no focal abnormality and no ductal dilatation is seen. No abnormality of the upper abdomen is noted on the few images obtained through the upper abdomen.  Musculoskeletal: The thoracic vertebrae are in normal alignment with minimal degenerative change. No rib abnormality is seen.  Review of the MIP images confirms the above findings.  IMPRESSION: 1. No evidence of pulmonary embolism. 2. However there are coarse and prominent markings in both lower lobes posteriorly with a small right pleural effusion, findings most consistent with developing pneumonia within both lower lobes. Recommend follow up by two-view chest x-ray.   Electronically Signed   By: Ivar Drape M.D.   On: 06/20/2018 15:04    Impression:  Patient is doing very well approximately 3 months status post resection of papillary fibro-elastoma from the tricuspid valve.    Plan:  I have encouraged the patient to continue to gradually increase her physical activity without any particular limitations at this time.  We have not recommended any changes to her current medications.  However, I question whether or not she really ever had pericarditis, and I would favor stopping colchicine at some point in the not too distant future.  All of the patient's questions have been addressed.  The patient will return to our office next fall for routine follow-up approximately 1 year following her surgery.  She will call and return sooner should specific problems or questions arise.    Valentina Gu. Roxy Manns, MD 07/06/2018 12:01 PM

## 2018-07-12 DIAGNOSIS — R82998 Other abnormal findings in urine: Secondary | ICD-10-CM | POA: Diagnosis not present

## 2018-07-12 DIAGNOSIS — Z0000 Encounter for general adult medical examination without abnormal findings: Secondary | ICD-10-CM | POA: Diagnosis not present

## 2018-07-12 DIAGNOSIS — E1129 Type 2 diabetes mellitus with other diabetic kidney complication: Secondary | ICD-10-CM | POA: Diagnosis not present

## 2018-07-18 DIAGNOSIS — Z9114 Patient's other noncompliance with medication regimen: Secondary | ICD-10-CM | POA: Diagnosis not present

## 2018-07-18 DIAGNOSIS — D508 Other iron deficiency anemias: Secondary | ICD-10-CM | POA: Diagnosis not present

## 2018-07-18 DIAGNOSIS — R808 Other proteinuria: Secondary | ICD-10-CM | POA: Diagnosis not present

## 2018-07-18 DIAGNOSIS — I2789 Other specified pulmonary heart diseases: Secondary | ICD-10-CM | POA: Diagnosis not present

## 2018-07-18 DIAGNOSIS — N182 Chronic kidney disease, stage 2 (mild): Secondary | ICD-10-CM | POA: Diagnosis not present

## 2018-07-18 DIAGNOSIS — Z0000 Encounter for general adult medical examination without abnormal findings: Secondary | ICD-10-CM | POA: Diagnosis not present

## 2018-07-18 DIAGNOSIS — J3089 Other allergic rhinitis: Secondary | ICD-10-CM | POA: Diagnosis not present

## 2018-07-18 DIAGNOSIS — E1129 Type 2 diabetes mellitus with other diabetic kidney complication: Secondary | ICD-10-CM | POA: Diagnosis not present

## 2018-07-18 DIAGNOSIS — Z1331 Encounter for screening for depression: Secondary | ICD-10-CM | POA: Diagnosis not present

## 2018-07-18 DIAGNOSIS — E7800 Pure hypercholesterolemia, unspecified: Secondary | ICD-10-CM | POA: Diagnosis not present

## 2018-07-21 ENCOUNTER — Other Ambulatory Visit: Payer: Self-pay | Admitting: Internal Medicine

## 2018-07-21 DIAGNOSIS — Z1231 Encounter for screening mammogram for malignant neoplasm of breast: Secondary | ICD-10-CM

## 2018-07-31 MED FILL — JANUMET XR 100-1,000 MG TAB: 100-1000 | 90 days supply | Qty: 90 | Fill #0

## 2018-08-10 DIAGNOSIS — Z124 Encounter for screening for malignant neoplasm of cervix: Secondary | ICD-10-CM | POA: Diagnosis not present

## 2018-08-10 DIAGNOSIS — Z1151 Encounter for screening for human papillomavirus (HPV): Secondary | ICD-10-CM | POA: Diagnosis not present

## 2018-08-11 DIAGNOSIS — Z1212 Encounter for screening for malignant neoplasm of rectum: Secondary | ICD-10-CM | POA: Diagnosis not present

## 2018-08-14 ENCOUNTER — Encounter: Payer: Self-pay | Admitting: Interventional Cardiology

## 2018-08-30 NOTE — Progress Notes (Signed)
Cardiology Office Note:    Date:  08/31/2018   ID:  Ariana Ramirez, DOB [birth year], MRN [id number]  PCP:  Haywood Pao, MD  Cardiologist:  Sinclair Grooms, MD   Referring MD: Haywood Pao, MD   Chief Complaint  Patient presents with  . Advice Only    Fibro-elastoma    History of Present Illness:    Ariana Ramirez is a 58 y.o. female with a hx of tricuspid valve fibroelastoma s/p resection.  Had an emergency room visit in late December due to racing heart.  Tracings identified sinus tachycardia.  No evidence of atrial fibrillation could be found.  She is now off colchicine and metoprolol.  She is back at work and having no difficulty.  Rapid heart rate is not return.  Past Medical History:  Diagnosis Date  . Diabetes mellitus   . GERD (gastroesophageal reflux disease)   . Hyperlipidemia   . Pulmonary hypertension ([place])   . s/p minimally invasive resection of papillary fibroelastoma from tricuspid valve 04/07/2018    Past Surgical History:  Procedure Laterality Date  . APPENDECTOMY    . CESAREAN SECTION    . ECTOPIC PREGNANCY SURGERY    . MINIMALLY INVASIVE EXCISION OF ATRIAL MYXOMA N/A 04/07/2018   Procedure: MINIMALLY INVASIVE RESECTION OF TRICUSPID VALVE MASS;  Surgeon: Rexene Alberts, MD;  Location: [facility];  Service: Open Heart Surgery;  Laterality: N/A;  . TEE WITHOUT CARDIOVERSION N/A 02/28/2018   Procedure: TRANSESOPHAGEAL ECHOCARDIOGRAM (TEE);  Surgeon: Sueanne Margarita, MD;  Location: [facility] ENDOSCOPY;  Service: Cardiovascular;  Laterality: N/A;  . TEE WITHOUT CARDIOVERSION N/A 04/07/2018   Procedure: TRANSESOPHAGEAL ECHOCARDIOGRAM (TEE);  Surgeon: Rexene Alberts, MD;  Location: [facility];  Service: Open Heart Surgery;  Laterality: N/A;  . TUBAL LIGATION      Current Medications: Current Meds  Medication Sig  . fluticasone (FLONASE) 50 MCG/ACT nasal spray Place 2 sprays into both nostrils daily as needed for allergies.  Marland Kitchen ibuprofen (ADVIL,MOTRIN)  200 MG tablet Take 200 mg by mouth every 6 (six) hours as needed.  Marland Kitchen JANUMET XR [phone] MG TB24 Take 1 tablet by mouth daily.  . Multiple Vitamin (MULTIVITAMIN WITH MINERALS) TABS tablet Take 1 tablet by mouth every other day.   . pantoprazole (PROTONIX) 40 MG tablet Take 40 mg by mouth 2 (two) times daily.  . simvastatin (ZOCOR) 40 MG tablet Take 40 mg by mouth every evening.  . triamterene-hydrochlorothiazide (MAXZIDE-25) 37.5-25 MG per tablet Take 1 tablet by mouth daily.     Allergies:   Hydrocodone; Percocet [oxycodone-acetaminophen]; Food; and Pork-derived products   Social History   Socioeconomic History  . Marital status: Married    Spouse name: Not on file  . Number of children: Not on file  . Years of education: Not on file  . Highest education level: Not on file  Occupational History  . Not on file  Social Needs  . Financial resource strain: Not on file  . Food insecurity:    Worry: Not on file    Inability: Not on file  . Transportation needs:    Medical: Not on file    Non-medical: Not on file  Tobacco Use  . Smoking status: Never Smoker  . Smokeless tobacco: Never Used  Substance and Sexual Activity  . Alcohol use: No  . Drug use: No  . Sexual activity: Not on file  Lifestyle  . Physical activity:    Days per week: Not on file  Minutes per session: Not on file  . Stress: Not on file  Relationships  . Social connections:    Talks on phone: Not on file    Gets together: Not on file    Attends religious service: Not on file    Active member of club or organization: Not on file    Attends meetings of clubs or organizations: Not on file    Relationship status: Not on file  Other Topics Concern  . Not on file  Social History Narrative  . Not on file     Family History: The patient's family history includes Hypertension in her mother, paternal grandmother, and another family member; Stroke in her father.  ROS:   Please see the history of present  illness.    Some mild right lateral thoracic tenderness.  All other systems reviewed and are negative.  EKGs/Labs/Other Studies Reviewed:    The following studies were reviewed today:  No new data other than echocardiogram done after the emergency room visit recently: Study Conclusions  - Left ventricle: The cavity size was normal. Systolic function was   normal. The estimated ejection fraction was in the range of 60%   to 65%. Wall motion was normal; there were no regional wall   motion abnormalities. Left ventricular diastolic function   parameters were normal. - Tricuspid valve: There was moderate regurgitation. Estimated PA systolic pressure 30 mmHg   EKG:  EKG tracing from 06/20/2018 demonstrates normal sinus rhythm with diffuse ST elevation (less than or equal to 1 mm).  Recent Labs: 04/03/2018: ALT 18 04/08/2018: Magnesium 2.1 06/20/2018: BUN 20; Creatinine, Ser 1.00; Hemoglobin 10.6; Platelets 313; Potassium 3.5; Sodium 138  Recent Lipid Panel No results found for: CHOL, TRIG, HDL, CHOLHDL, VLDL, LDLCALC, LDLDIRECT  Physical Exam:    VS:  BP 104/66   Pulse 84   Ht 5\' 6"  (1.676 m)   Wt 184 lb 3.2 oz (83.6 kg)   SpO2 94%   BMI 29.73 kg/m     Wt Readings from Last 3 Encounters:  08/31/18 184 lb 3.2 oz (83.6 kg)  07/06/18 183 lb (83 kg)  06/20/18 178 lb (80.7 kg)     GEN: Healthy-appearing. No acute distress HEENT: Normal NECK: No JVD. LYMPHATICS: No lymphadenopathy CARDIAC: RRR.  No murmur, no gallop, no edema VASCULAR: 2+ bilateral pulses, no bruits RESPIRATORY:  Clear to auscultation without rales, wheezing or rhonchi  ABDOMEN: Soft, non-tender, non-distended, No pulsatile mass, MUSCULOSKELETAL: No deformity  SKIN: Warm and dry NEUROLOGIC:  Alert and oriented x 3 PSYCHIATRIC:  Normal affect   ASSESSMENT:    1. Tricuspid valve mass   2. Pulmonary HTN ([place])   3. Type 2 diabetes mellitus without complication, without long-term current use of insulin  (HCC)    PLAN:    In order of problems listed above:  1. Resected and pathology report demonstrated fibro-elastoma.  Recent echo suggests moderate tricuspid regurgitation which is not audible by auscultation. 2. No clinical evidence of pulmonary hypertension with recently estimated at 30 mmHg. 3. Followed by primary care.  Target A1c less than 7.  Overall, doing well.  Clinical follow-up in 6 to 9 months.  We will repeat an echo again in approximately 12 months.  No restrictions from cardiovascular standpoint.   Medication Adjustments/Labs and Tests Ordered: Current medicines are reviewed at length with the patient today.  Concerns regarding medicines are outlined above.  No orders of the defined types were placed in this encounter.  No orders of  the defined types were placed in this encounter.   Patient Instructions  Medication Instructions:  Your physician recommends that you continue on your current medications as directed. Please refer to the Current Medication list given to you today.  If you need a refill on your cardiac medications before your next appointment, please call your pharmacy.   Lab work: None If you have labs (blood work) drawn today and your tests are completely normal, you will receive your results only by: Marland Kitchen MyChart Message (if you have MyChart) OR . A paper copy in the mail If you have any lab test that is abnormal or we need to change your treatment, we will call you to review the results.  Testing/Procedures: None  Follow-Up: At [facility], you and your health needs are our priority.  As part of our continuing mission to provide you with exceptional heart care, we have created designated Provider Care Teams.  These Care Teams include your primary Cardiologist (physician) and Advanced Practice Providers (APPs -  Physician Assistants and Nurse Practitioners) who all work together to provide you with the care you need, when you need it. You will need  a follow up appointment in 6 months.  Please call our office 2 months in advance to schedule this appointment.  You may see Sinclair Grooms, MD or one of the following Advanced Practice Providers on your designated Care Team:   Truitt Merle, NP Cecilie Kicks, NP . Kathyrn Drown, NP  Any Other Special Instructions Will Be Listed Below (If Applicable).       Signed, Sinclair Grooms, MD  08/31/2018 1:25 PM    [facility]

## 2018-08-31 ENCOUNTER — Encounter: Payer: Self-pay | Admitting: Interventional Cardiology

## 2018-08-31 ENCOUNTER — Other Ambulatory Visit: Payer: Self-pay

## 2018-08-31 ENCOUNTER — Ambulatory Visit: Payer: 59 | Admitting: Interventional Cardiology

## 2018-08-31 VITALS — BP 104/66 | HR 84 | Ht 66.0 in | Wt 184.2 lb

## 2018-08-31 DIAGNOSIS — I078 Other rheumatic tricuspid valve diseases: Secondary | ICD-10-CM | POA: Diagnosis not present

## 2018-08-31 DIAGNOSIS — I272 Pulmonary hypertension, unspecified: Secondary | ICD-10-CM | POA: Diagnosis not present

## 2018-08-31 DIAGNOSIS — E119 Type 2 diabetes mellitus without complications: Secondary | ICD-10-CM

## 2018-08-31 NOTE — Patient Instructions (Signed)

## 2018-09-06 MED FILL — TRIAMTERENE/HCTZ 37.5/25 TB: 37.5-25 | 90 days supply | Qty: 90 | Fill #0

## 2018-09-06 MED FILL — ATORVASTATIN 80 MG TABLET: 80 | 90 days supply | Qty: 90 | Fill #0

## 2018-10-26 MED FILL — JANUMET XR 100-1,000 MG TAB: 100-1000 | 60 days supply | Qty: 60 | Fill #0

## 2018-12-25 MED FILL — TRIAMTERENE/HCTZ 37.5/25 TB: 37.5-25 | 90 days supply | Qty: 90 | Fill #1

## 2018-12-25 MED FILL — ATORVASTATIN 80 MG TABLET: 80 | 90 days supply | Qty: 90 | Fill #1

## 2018-12-25 MED FILL — JANUMET XR 100-1,000 MG TAB: 100-1000 | 30 days supply | Qty: 30 | Fill #0

## 2019-01-29 MED FILL — JANUMET XR 100-1,000 MG TAB: 100-1000 | 60 days supply | Qty: 60 | Fill #0

## 2019-02-08 DIAGNOSIS — K219 Gastro-esophageal reflux disease without esophagitis: Secondary | ICD-10-CM | POA: Diagnosis not present

## 2019-02-08 DIAGNOSIS — R809 Proteinuria, unspecified: Secondary | ICD-10-CM | POA: Diagnosis not present

## 2019-02-08 DIAGNOSIS — D509 Iron deficiency anemia, unspecified: Secondary | ICD-10-CM | POA: Diagnosis not present

## 2019-02-08 DIAGNOSIS — E7800 Pure hypercholesterolemia, unspecified: Secondary | ICD-10-CM | POA: Diagnosis not present

## 2019-02-08 DIAGNOSIS — E1129 Type 2 diabetes mellitus with other diabetic kidney complication: Secondary | ICD-10-CM | POA: Diagnosis not present

## 2019-02-08 DIAGNOSIS — N182 Chronic kidney disease, stage 2 (mild): Secondary | ICD-10-CM | POA: Diagnosis not present

## 2019-02-08 DIAGNOSIS — I129 Hypertensive chronic kidney disease with stage 1 through stage 4 chronic kidney disease, or unspecified chronic kidney disease: Secondary | ICD-10-CM | POA: Diagnosis not present

## 2019-02-08 DIAGNOSIS — E669 Obesity, unspecified: Secondary | ICD-10-CM | POA: Diagnosis not present

## 2019-03-08 DIAGNOSIS — I129 Hypertensive chronic kidney disease with stage 1 through stage 4 chronic kidney disease, or unspecified chronic kidney disease: Secondary | ICD-10-CM | POA: Diagnosis not present

## 2019-03-08 DIAGNOSIS — R7989 Other specified abnormal findings of blood chemistry: Secondary | ICD-10-CM | POA: Diagnosis not present

## 2019-03-08 DIAGNOSIS — E1129 Type 2 diabetes mellitus with other diabetic kidney complication: Secondary | ICD-10-CM | POA: Diagnosis not present

## 2019-03-08 DIAGNOSIS — H52223 Regular astigmatism, bilateral: Secondary | ICD-10-CM | POA: Diagnosis not present

## 2019-03-08 DIAGNOSIS — I10 Essential (primary) hypertension: Secondary | ICD-10-CM | POA: Diagnosis not present

## 2019-03-23 MED FILL — JANUMET XR 100-1,000 MG TAB: 100-1000 | 60 days supply | Qty: 60 | Fill #1

## 2019-03-26 ENCOUNTER — Ambulatory Visit: Payer: 59 | Admitting: Thoracic Surgery (Cardiothoracic Vascular Surgery)

## 2019-04-02 ENCOUNTER — Ambulatory Visit: Payer: 59 | Admitting: Thoracic Surgery (Cardiothoracic Vascular Surgery)

## 2019-04-13 MED FILL — TRIAMTERENE-HCTZ 37.5-25 MG: 37.5-25 | 90 days supply | Qty: 90 | Fill #2

## 2019-04-13 MED FILL — ATORVASTATIN 80 MG TABLET: 80 | 90 days supply | Qty: 90 | Fill #2

## 2019-04-16 ENCOUNTER — Telehealth (INDEPENDENT_AMBULATORY_CARE_PROVIDER_SITE_OTHER): Payer: 59 | Admitting: Thoracic Surgery (Cardiothoracic Vascular Surgery)

## 2019-04-16 ENCOUNTER — Other Ambulatory Visit: Payer: Self-pay

## 2019-04-16 DIAGNOSIS — D151 Benign neoplasm of heart: Secondary | ICD-10-CM

## 2019-04-16 NOTE — Patient Instructions (Signed)
Continue all previous medications without any changes at this time  

## 2019-04-16 NOTE — Progress Notes (Signed)
[address]       [place], [address]             [phone]     CARDIOTHORACIC SURGERY TELEPHONE VIRTUAL OFFICE NOTE  Referring Provider is Belva Crome, MD Primary Cardiologist is Sinclair Grooms, MD PCP is Tisovec, Fransico Him, MD   HPI:  I spoke with Ariana Ramirez (DOB [birth year] ) via telephone on 04/16/2019 at 11:51 AM and verified that I was speaking with the correct person using more than one form of identification.  We discussed the reason(s) for conducting our visit virtually instead of in-person.  The patient expressed understanding the circumstances and agreed to proceed as described.   Patient is a 58 year old female who underwent minimally invasive resection of papillary fibro-elastoma from the tricuspid valve on April 07, 2018.  I spoke with her over the telephone today to check and see how she has been doing.  She was last seen here in our office on July 06, 2018 and since then she was seen in follow-up with Dr. Tamala Julian who saw her last March.  Patient states that she is doing quite well.  She reports no residual pain or other sequelae related to her surgery.  She specifically denies any pain or soreness in her chest related to her surgical incision.  She also denies any symptoms of exertional chest pain or shortness of breath.  She reports no new medical problems and she states that overall she is doing quite well.   Current Outpatient Medications  Medication Sig Dispense Refill  . fluticasone (FLONASE) 50 MCG/ACT nasal spray Place 2 sprays into both nostrils daily as needed for allergies.    Marland Kitchen ibuprofen (ADVIL,MOTRIN) 200 MG tablet Take 200 mg by mouth every 6 (six) hours as needed.    Marland Kitchen JANUMET XR [phone] MG TB24 Take 1 tablet by mouth daily.  3  . Multiple Vitamin (MULTIVITAMIN WITH MINERALS) TABS tablet Take 1 tablet by mouth every other day.     . pantoprazole (PROTONIX) 40 MG tablet Take 40 mg by mouth 2 (two) times daily.    .  simvastatin (ZOCOR) 40 MG tablet Take 40 mg by mouth every evening.    . triamterene-hydrochlorothiazide (MAXZIDE-25) 37.5-25 MG per tablet Take 1 tablet by mouth daily.     No current facility-administered medications for this visit.      Diagnostic Tests:  n/a   Impression:  Patient is doing well approximately 1 year status post minimally invasive resection of benign papillary fibroelastoma from the tricuspid valve.  She denies any symptoms of exertional shortness of breath or other sequelae related to her previous surgery.  Follow-up echocardiogram performed June 20, 2018 revealed normal left ventricular systolic function.  There was normal right ventricular size and systolic function.  There was reportedly moderate tricuspid regurgitation.     Plan:  We have not recommended any change the patient's current medications.  I have reassured the patient that these benign tumors are unlikely to recur, but periodic follow-up echocardiogram remains reasonable.  She will continue to follow-up with Dr. Tamala Julian and return to our office in the future only should specific problems or questions arise.    I discussed limitations of evaluation and management via telephone.  The patient was advised to call back for repeat telephone consultation or to seek an in-person evaluation if questions arise or the patient's clinical condition changes in any significant manner.  I spent in excess of 5  minutes of non-face-to-face time during the conduct of this telephone virtual office consultation.     Valentina Gu. Roxy Manns, MD 04/16/2019 11:51 AM

## 2019-06-04 MED FILL — JANUMET XR 100-1,000 MG TAB: 100-1000 | 60 days supply | Qty: 60 | Fill #2

## 2019-06-20 MED FILL — PANTOPRAZOLE SOD DR 40 MG T: 40 | 90 days supply | Qty: 180 | Fill #0

## 2019-07-09 MED FILL — TRIAMTERENE/HCTZ 37.5/25 TB: 37.5-25 | 90 days supply | Qty: 90 | Fill #3

## 2019-07-26 DIAGNOSIS — Z0000 Encounter for general adult medical examination without abnormal findings: Secondary | ICD-10-CM | POA: Diagnosis not present

## 2019-07-26 DIAGNOSIS — E1129 Type 2 diabetes mellitus with other diabetic kidney complication: Secondary | ICD-10-CM | POA: Diagnosis not present

## 2019-07-26 DIAGNOSIS — E7800 Pure hypercholesterolemia, unspecified: Secondary | ICD-10-CM | POA: Diagnosis not present

## 2019-08-02 DIAGNOSIS — K219 Gastro-esophageal reflux disease without esophagitis: Secondary | ICD-10-CM | POA: Diagnosis not present

## 2019-08-02 DIAGNOSIS — Z0000 Encounter for general adult medical examination without abnormal findings: Secondary | ICD-10-CM | POA: Diagnosis not present

## 2019-08-02 DIAGNOSIS — I129 Hypertensive chronic kidney disease with stage 1 through stage 4 chronic kidney disease, or unspecified chronic kidney disease: Secondary | ICD-10-CM | POA: Diagnosis not present

## 2019-08-02 DIAGNOSIS — E7800 Pure hypercholesterolemia, unspecified: Secondary | ICD-10-CM | POA: Diagnosis not present

## 2019-08-02 DIAGNOSIS — R809 Proteinuria, unspecified: Secondary | ICD-10-CM | POA: Diagnosis not present

## 2019-08-02 DIAGNOSIS — N182 Chronic kidney disease, stage 2 (mild): Secondary | ICD-10-CM | POA: Diagnosis not present

## 2019-08-02 DIAGNOSIS — E663 Overweight: Secondary | ICD-10-CM | POA: Diagnosis not present

## 2019-08-02 DIAGNOSIS — Z1331 Encounter for screening for depression: Secondary | ICD-10-CM | POA: Diagnosis not present

## 2019-08-02 DIAGNOSIS — I272 Pulmonary hypertension, unspecified: Secondary | ICD-10-CM | POA: Diagnosis not present

## 2019-08-02 DIAGNOSIS — Z1339 Encounter for screening examination for other mental health and behavioral disorders: Secondary | ICD-10-CM | POA: Diagnosis not present

## 2019-08-02 DIAGNOSIS — E1129 Type 2 diabetes mellitus with other diabetic kidney complication: Secondary | ICD-10-CM | POA: Diagnosis not present

## 2019-08-02 MED FILL — BENAZEPRIL HCL 10 MG TABLET: 10 | 90 days supply | Qty: 90 | Fill #0

## 2019-08-02 MED FILL — HYDROCHLOROTHIAZIDE 25 MG T: 25 | 90 days supply | Qty: 90 | Fill #0

## 2019-08-03 ENCOUNTER — Other Ambulatory Visit: Payer: Self-pay | Admitting: Internal Medicine

## 2019-08-03 DIAGNOSIS — Z1231 Encounter for screening mammogram for malignant neoplasm of breast: Secondary | ICD-10-CM

## 2019-08-03 DIAGNOSIS — R920 Mammographic microcalcification found on diagnostic imaging of breast: Secondary | ICD-10-CM

## 2019-08-06 MED FILL — JANUMET XR 100-1,000 MG TAB: 100-1000 | 60 days supply | Qty: 60 | Fill #0

## 2019-08-13 MED FILL — OMRON 3 SERIES BP MONITOR D: 30 days supply | Qty: 1 | Fill #0

## 2019-08-16 MED FILL — LOSARTAN POTASSIUM 25 MG TA: 25 | 30 days supply | Qty: 30 | Fill #0

## 2019-08-30 ENCOUNTER — Ambulatory Visit
Admission: RE | Admit: 2019-08-30 | Discharge: 2019-08-30 | Disposition: A | Discharge status: Home / Self Care | Payer: 59 | Source: Ambulatory Visit | Attending: Internal Medicine | Admitting: Internal Medicine

## 2019-08-30 ENCOUNTER — Other Ambulatory Visit: Payer: Self-pay

## 2019-08-30 DIAGNOSIS — R921 Mammographic calcification found on diagnostic imaging of breast: Secondary | ICD-10-CM | POA: Diagnosis not present

## 2019-08-30 DIAGNOSIS — R82998 Other abnormal findings in urine: Secondary | ICD-10-CM | POA: Diagnosis not present

## 2019-08-30 DIAGNOSIS — R920 Mammographic microcalcification found on diagnostic imaging of breast: Secondary | ICD-10-CM

## 2019-09-14 MED FILL — LOSARTAN POTASSIUM 25 MG TA: 25 | 30 days supply | Qty: 30 | Fill #1

## 2019-09-14 MED FILL — ATORVASTATIN 80 MG TABLET: 80 | 90 days supply | Qty: 90 | Fill #0

## 2019-09-19 DIAGNOSIS — Z1212 Encounter for screening for malignant neoplasm of rectum: Secondary | ICD-10-CM | POA: Diagnosis not present

## 2019-10-10 MED FILL — LOSARTAN POTASSIUM 25 MG TA: 25 | 30 days supply | Qty: 30 | Fill #2

## 2019-10-10 MED FILL — JANUMET XR 100-1,000 MG TAB: 100-1000 | 30 days supply | Qty: 30 | Fill #1

## 2019-11-12 MED FILL — JANUMET XR 100-1,000 MG TAB: 100-1000 | 30 days supply | Qty: 30 | Fill #2

## 2019-11-12 MED FILL — HYDROCHLOROTHIAZIDE 25 MG T: 25 | 90 days supply | Qty: 90 | Fill #1

## 2019-12-13 MED FILL — ATORVASTATIN 80 MG TABLET: 80 | 90 days supply | Qty: 90 | Fill #1

## 2019-12-13 MED FILL — JANUMET XR 100-1,000 MG TAB: 100-1000 | 30 days supply | Qty: 30 | Fill #3

## 2020-01-11 MED FILL — LOSARTAN POTASSIUM 25 MG TA: 25 | 30 days supply | Qty: 30 | Fill #3

## 2020-01-11 MED FILL — JANUMET XR 100-1,000 MG TAB: 100-1000 | 30 days supply | Qty: 30 | Fill #4

## 2020-01-28 MED FILL — AMOXICILLIN 500 MG CAPSULE: 500 | 7 days supply | Qty: 28 | Fill #0

## 2020-02-09 ENCOUNTER — Other Ambulatory Visit (HOSPITAL_COMMUNITY): Payer: Self-pay | Admitting: Internal Medicine

## 2020-02-09 MED FILL — HYDROCHLOROTHIAZIDE 25 MG T: 25 | 90 days supply | Qty: 90 | Fill #2

## 2020-02-10 ENCOUNTER — Other Ambulatory Visit (HOSPITAL_COMMUNITY): Payer: Self-pay | Admitting: Internal Medicine

## 2020-02-11 MED FILL — JANUMET XR 100-1,000 MG TAB: 100-1000 | 30 days supply | Qty: 30 | Fill #0

## 2020-02-11 MED FILL — LOSARTAN POTASSIUM 25 MG TA: 25 | 30 days supply | Qty: 30 | Fill #0

## 2020-03-06 ENCOUNTER — Other Ambulatory Visit (HOSPITAL_COMMUNITY): Payer: Self-pay | Admitting: Internal Medicine

## 2020-03-06 DIAGNOSIS — E669 Obesity, unspecified: Secondary | ICD-10-CM | POA: Diagnosis not present

## 2020-03-06 DIAGNOSIS — E1129 Type 2 diabetes mellitus with other diabetic kidney complication: Secondary | ICD-10-CM | POA: Diagnosis not present

## 2020-03-06 DIAGNOSIS — I129 Hypertensive chronic kidney disease with stage 1 through stage 4 chronic kidney disease, or unspecified chronic kidney disease: Secondary | ICD-10-CM | POA: Diagnosis not present

## 2020-03-06 DIAGNOSIS — I272 Pulmonary hypertension, unspecified: Secondary | ICD-10-CM | POA: Diagnosis not present

## 2020-03-06 DIAGNOSIS — E7800 Pure hypercholesterolemia, unspecified: Secondary | ICD-10-CM | POA: Diagnosis not present

## 2020-03-06 DIAGNOSIS — R809 Proteinuria, unspecified: Secondary | ICD-10-CM | POA: Diagnosis not present

## 2020-03-06 DIAGNOSIS — N182 Chronic kidney disease, stage 2 (mild): Secondary | ICD-10-CM | POA: Diagnosis not present

## 2020-03-06 DIAGNOSIS — J309 Allergic rhinitis, unspecified: Secondary | ICD-10-CM | POA: Diagnosis not present

## 2020-03-06 DIAGNOSIS — K219 Gastro-esophageal reflux disease without esophagitis: Secondary | ICD-10-CM | POA: Diagnosis not present

## 2020-03-06 MED FILL — LOSARTAN POTASSIUM 25 MG TA: 25 | 30 days supply | Qty: 30 | Fill #0

## 2020-03-06 MED FILL — PANTOPRAZOLE SOD DR 40 MG T: 40 | 90 days supply | Qty: 180 | Fill #0

## 2020-03-07 MED FILL — JANUMET XR 100-1,000 MG TAB: 100-1000 | 30 days supply | Qty: 30 | Fill #1

## 2020-03-07 MED FILL — ATORVASTATIN 80 MG TABLET: 80 | 90 days supply | Qty: 90 | Fill #2

## 2020-04-10 MED FILL — LOSARTAN POTASSIUM 25 MG TA: 25 | 90 days supply | Qty: 90 | Fill #1

## 2020-04-10 MED FILL — JANUMET XR 100-1,000 MG TAB: 100-1000 | 30 days supply | Qty: 30 | Fill #2

## 2020-05-12 MED FILL — JANUMET XR 100-1,000 MG TAB: 100-1000 | 30 days supply | Qty: 30 | Fill #3

## 2020-05-12 MED FILL — HYDROCHLOROTHIAZIDE 25 MG T: 25 | 90 days supply | Qty: 90 | Fill #3

## 2020-06-06 IMAGING — DX DG CHEST 2V
2 series · 2 of 2 positions shown · non-contrast
Comparison: None.

CLINICAL DATA: Chronic cough

EXAM:
CHEST - 2 VIEW

[chest pa]
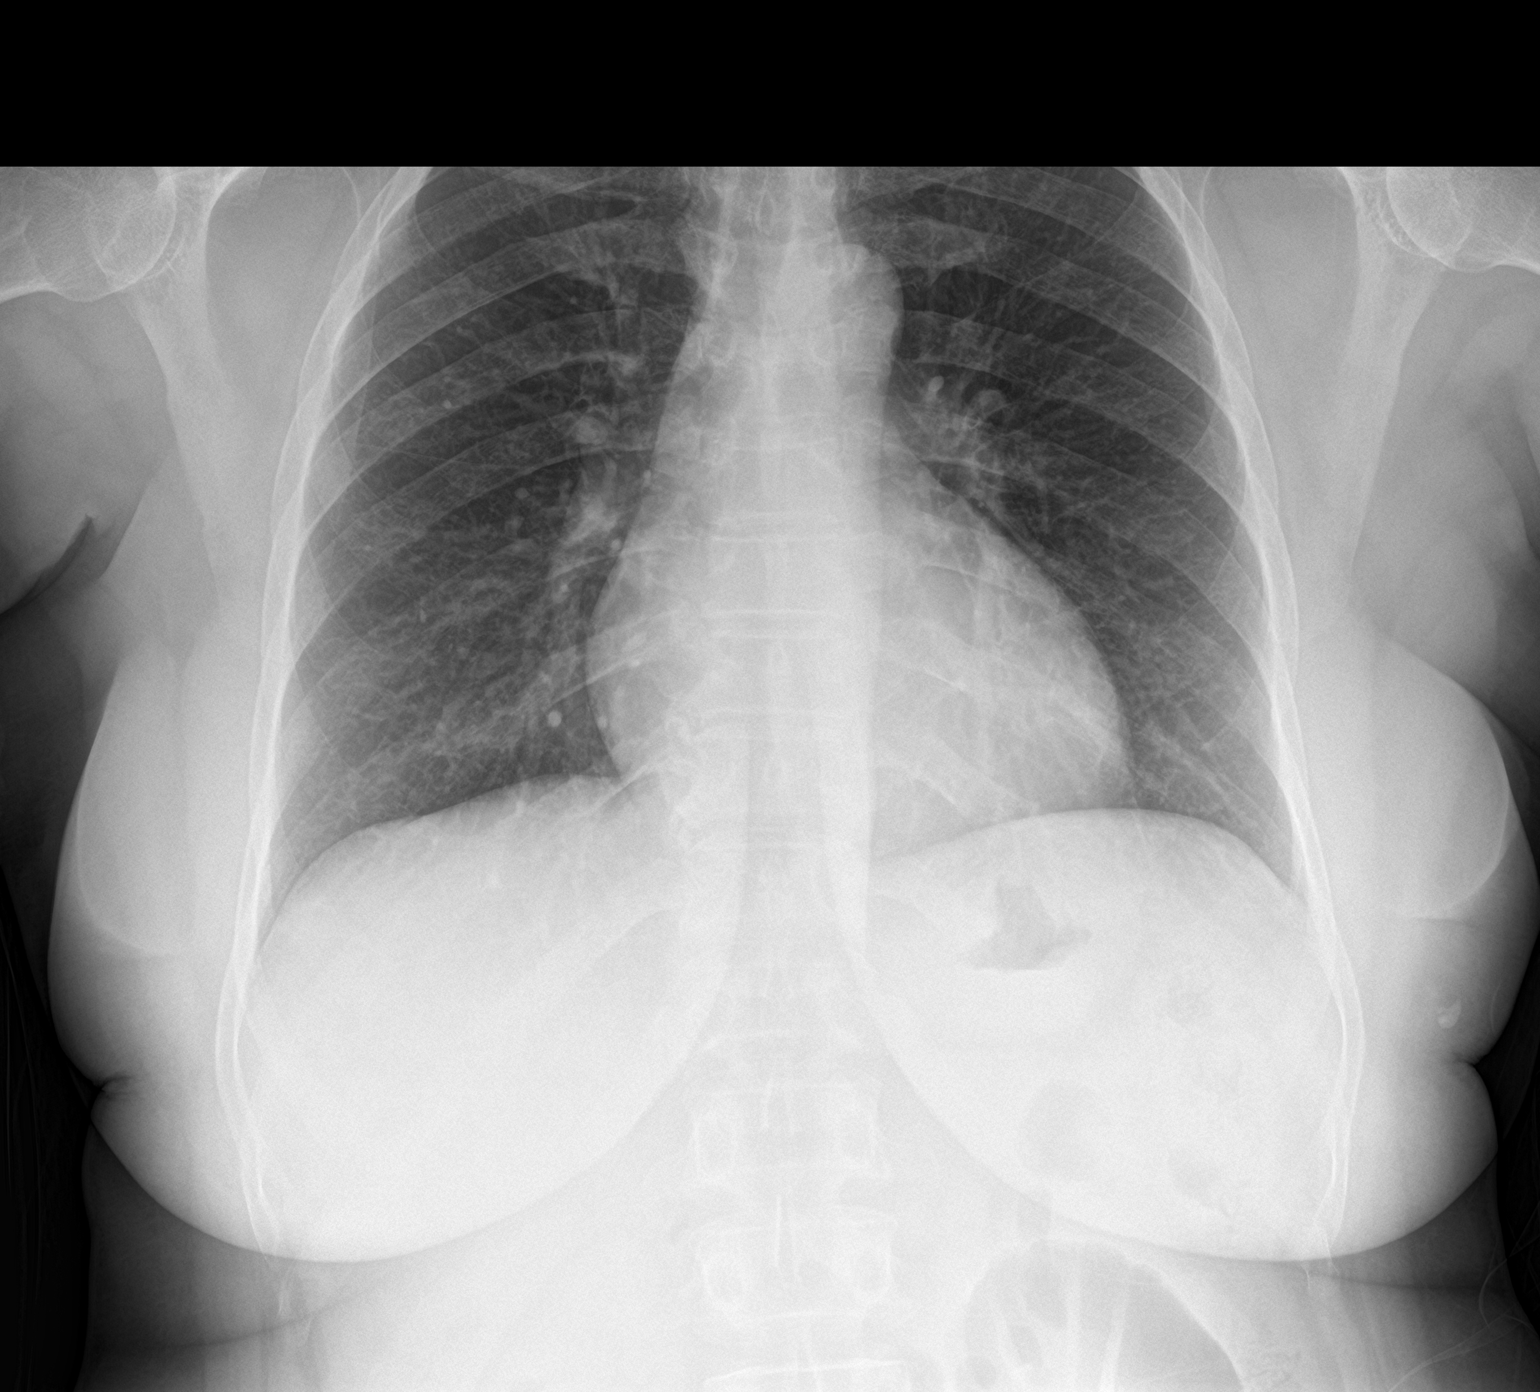

[chest lat]
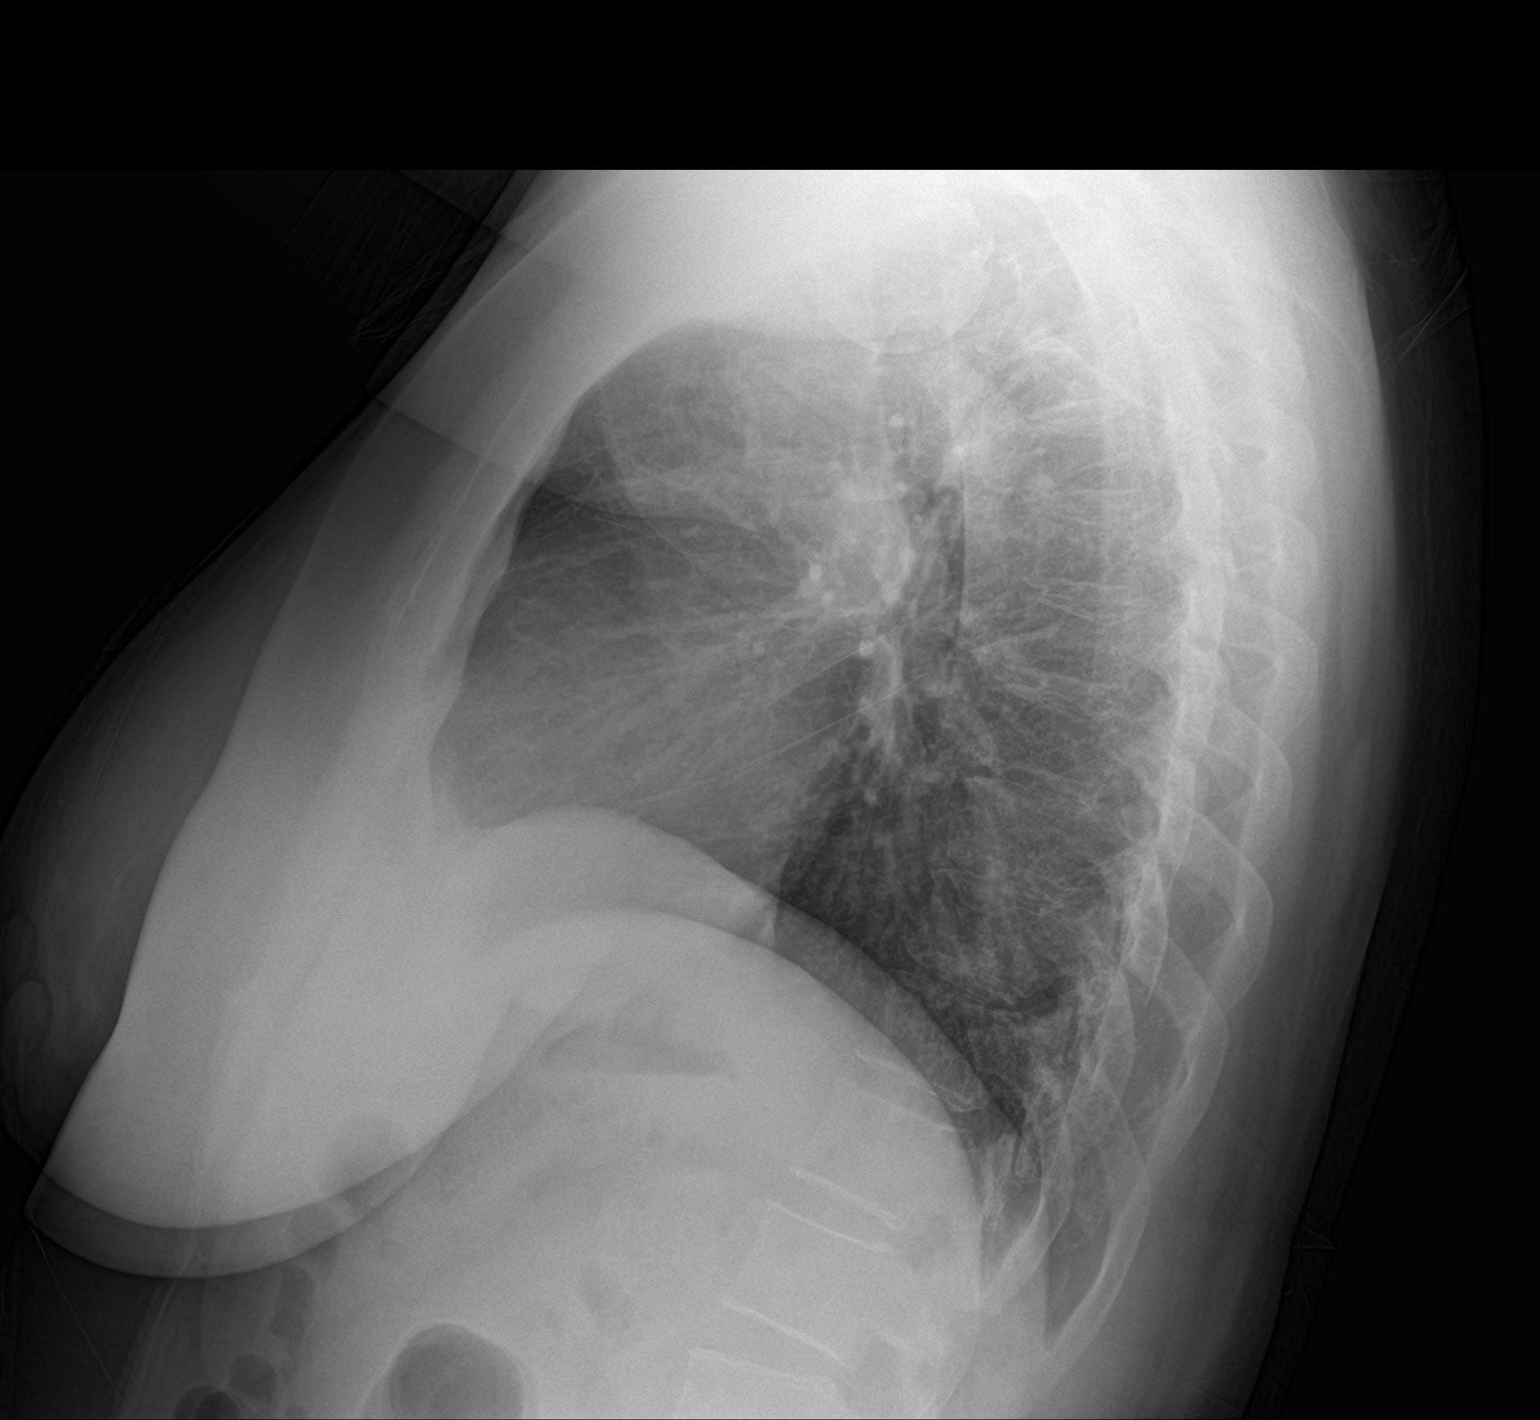

[2 of 2 positions shown; findings below may reference images not displayed]

FINDINGS: Cardiac shadow is within normal limits. The lungs are well aerated
bilaterally. No focal infiltrate or sizable effusion is seen. No
bony abnormality is noted.
IMPRESSION: No active cardiopulmonary disease.

## 2020-06-16 MED FILL — ATORVASTATIN 80 MG TABLET: 80 | 90 days supply | Qty: 90 | Fill #3

## 2020-06-16 MED FILL — JANUMET XR 100-1,000 MG TAB: 100-1000 | 30 days supply | Qty: 30 | Fill #4

## 2020-07-18 ENCOUNTER — Other Ambulatory Visit (HOSPITAL_COMMUNITY): Payer: Self-pay | Admitting: Registered Nurse

## 2020-07-18 MED FILL — LOSARTAN POTASSIUM 25 MG TA: 25 | 30 days supply | Qty: 30 | Fill #2

## 2020-07-18 MED FILL — JANUMET XR 100-1,000 MG TAB: 100-1000 | 30 days supply | Qty: 30 | Fill #5

## 2020-07-23 MED FILL — HYDROCHLOROTHIAZIDE 25 MG T: 25 | 90 days supply | Qty: 90 | Fill #0

## 2020-08-01 IMAGING — DX DG CHEST 1V PORT
1 series · 1 of 1 positions shown · non-contrast
Comparison: Four days ago

CLINICAL DATA: Minimally invasive resection of fibroid Yikang Guek
tricuspid valve

EXAM:
PORTABLE CHEST 1 VIEW

[chest]
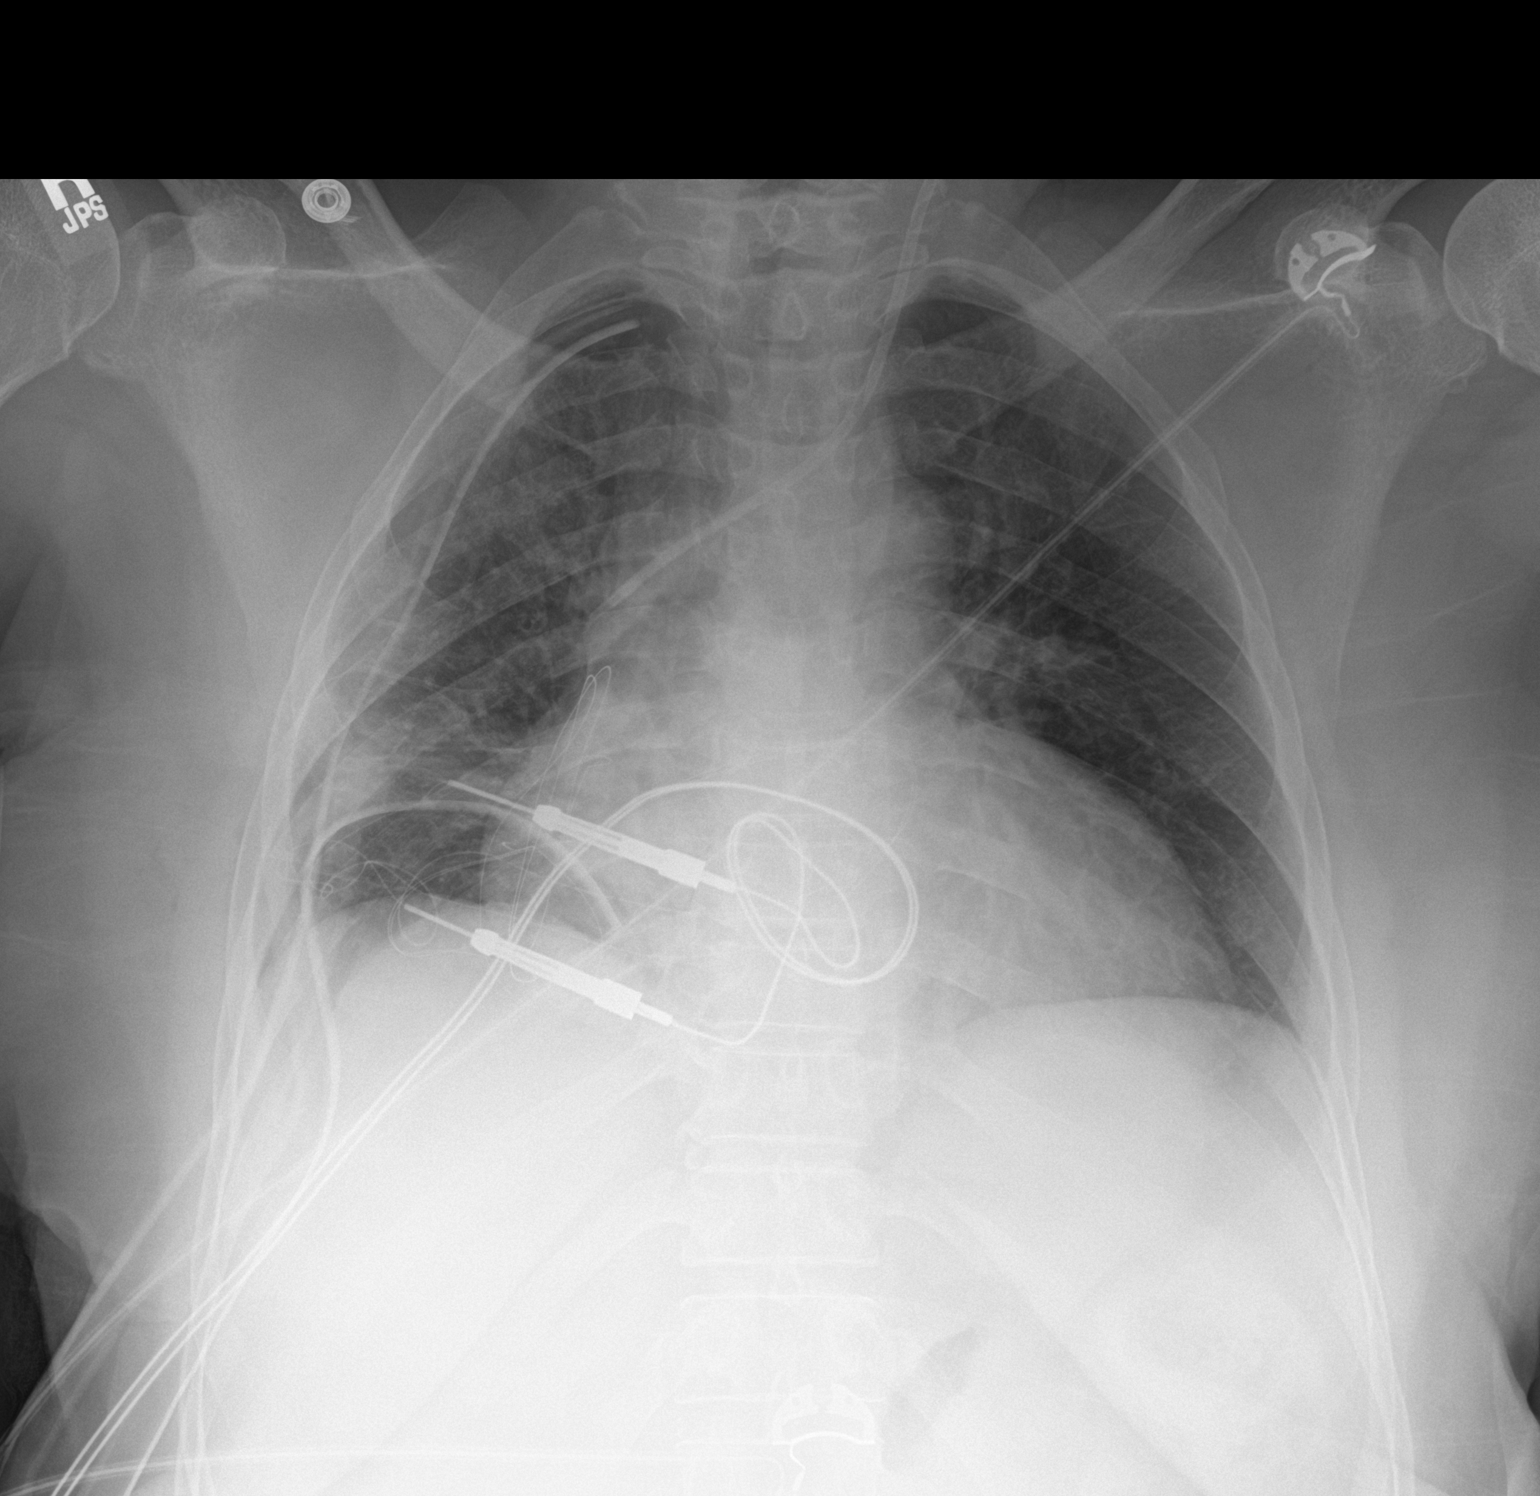

[1 of 1 positions shown; findings below may reference images not displayed]

FINDINGS: Left-sided central line with tip near the SVC origin. Thoracic
drains present on the right. Increased cardiopericardial size, even
when accounting for lower volumes. There is also vascular pedicle
widening. Streaky opacity in the right mid lung. No detected
pneumothorax. The left lung is clear.
IMPRESSION: 1. Cardiopericardial enlargement that is new from prior and notable
even when accounting for lower volumes.
2. Atelectasis on the right.

## 2020-08-01 MED FILL — HYDROCHLOROTHIAZIDE 25 MG T: 25 | 90 days supply | Qty: 90 | Fill #0

## 2020-08-02 IMAGING — DX DG CHEST 1V PORT
1 series · 1 of 1 positions shown · non-contrast
Comparison: One-view chest x-ray 04/07/2018

CLINICAL DATA: Minimally invasive resection of papillary
fibroelastoma of the tricuspid valve yesterday.

EXAM:
PORTABLE CHEST 1 VIEW

[chest ap]
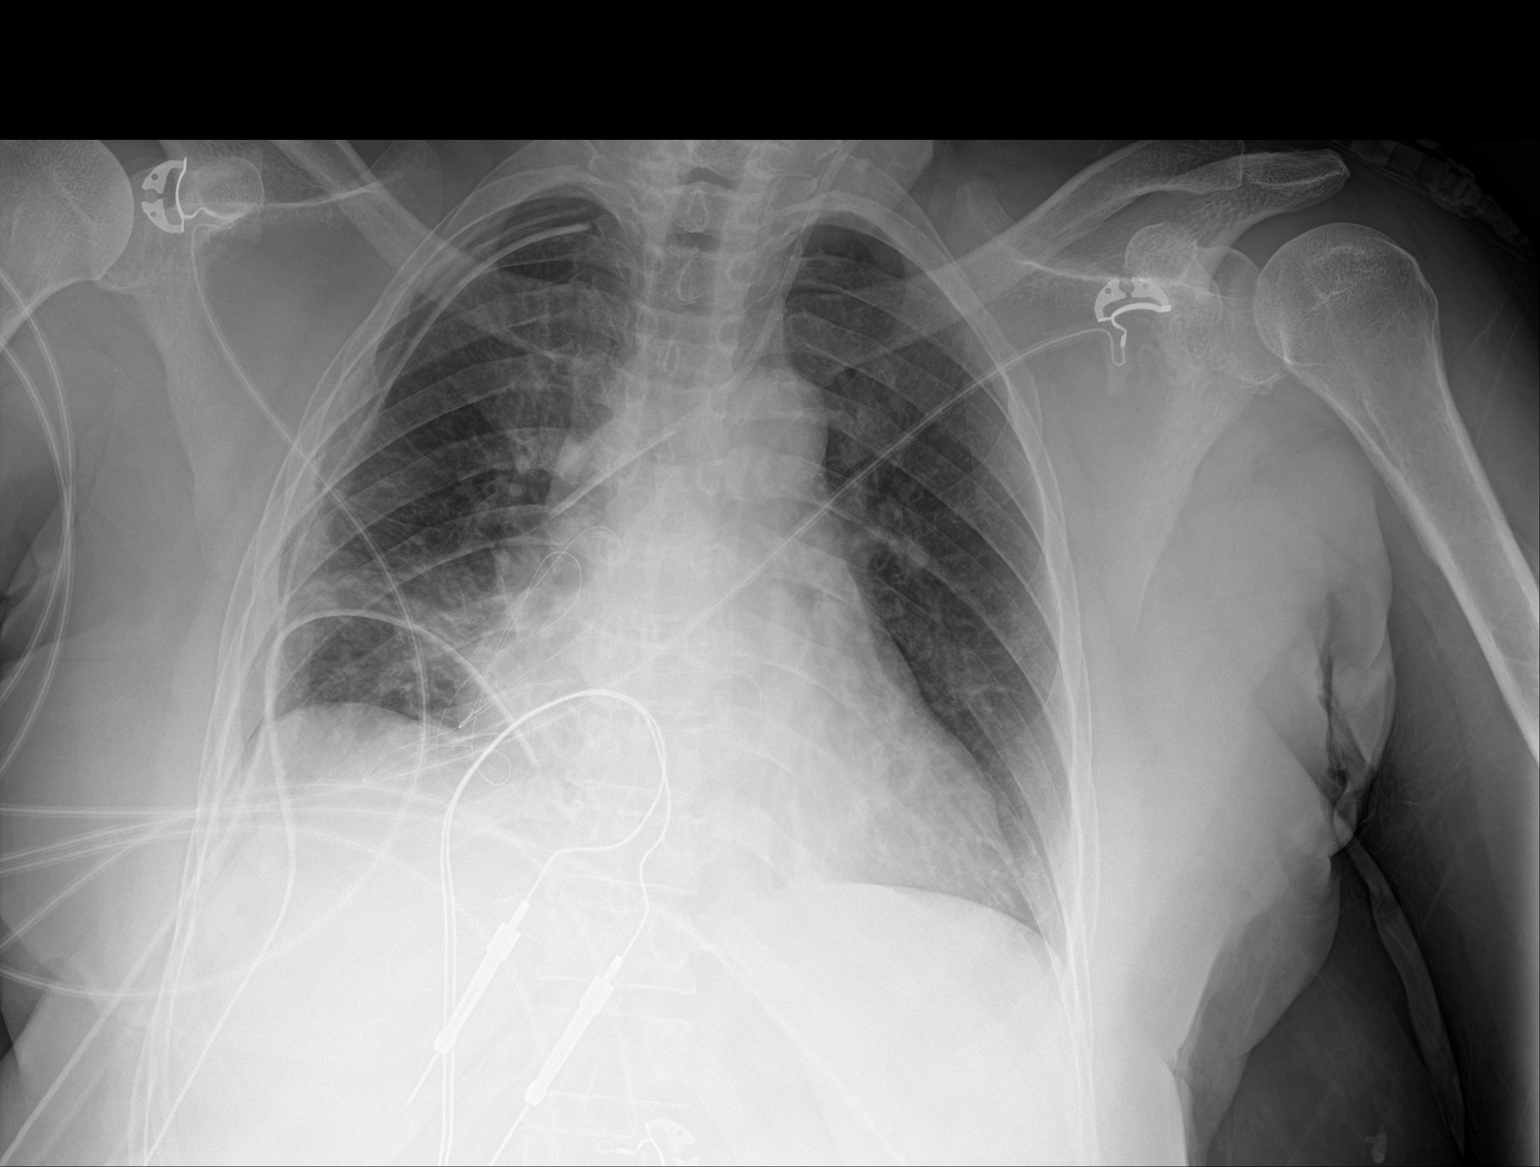

[1 of 1 positions shown; findings below may reference images not displayed]

FINDINGS: The heart size is exaggerated by low lung volumes. There is no
edema. Left IJ line is stable.

Right-sided chest tubes are in place. A small right pleural effusion
is present. There is no significant pneumothorax. Mild atelectasis
is present. Epicardial pacing wires are in place.
IMPRESSION: 1. Right-sided chest tube is without significant pneumothorax.
2. Small right pleural effusion.
3. Low lung volumes and mild cardiomegaly.

## 2020-08-03 IMAGING — DX DG CHEST 1V PORT
1 series · 1 of 1 positions shown · non-contrast
Comparison: One-view chest x-ray 04/08/2018

CLINICAL DATA: Atelectasis. Minimally invasive tricuspid valve
surgery 2 days ago.

EXAM:
PORTABLE CHEST 1 VIEW

[chest ap]
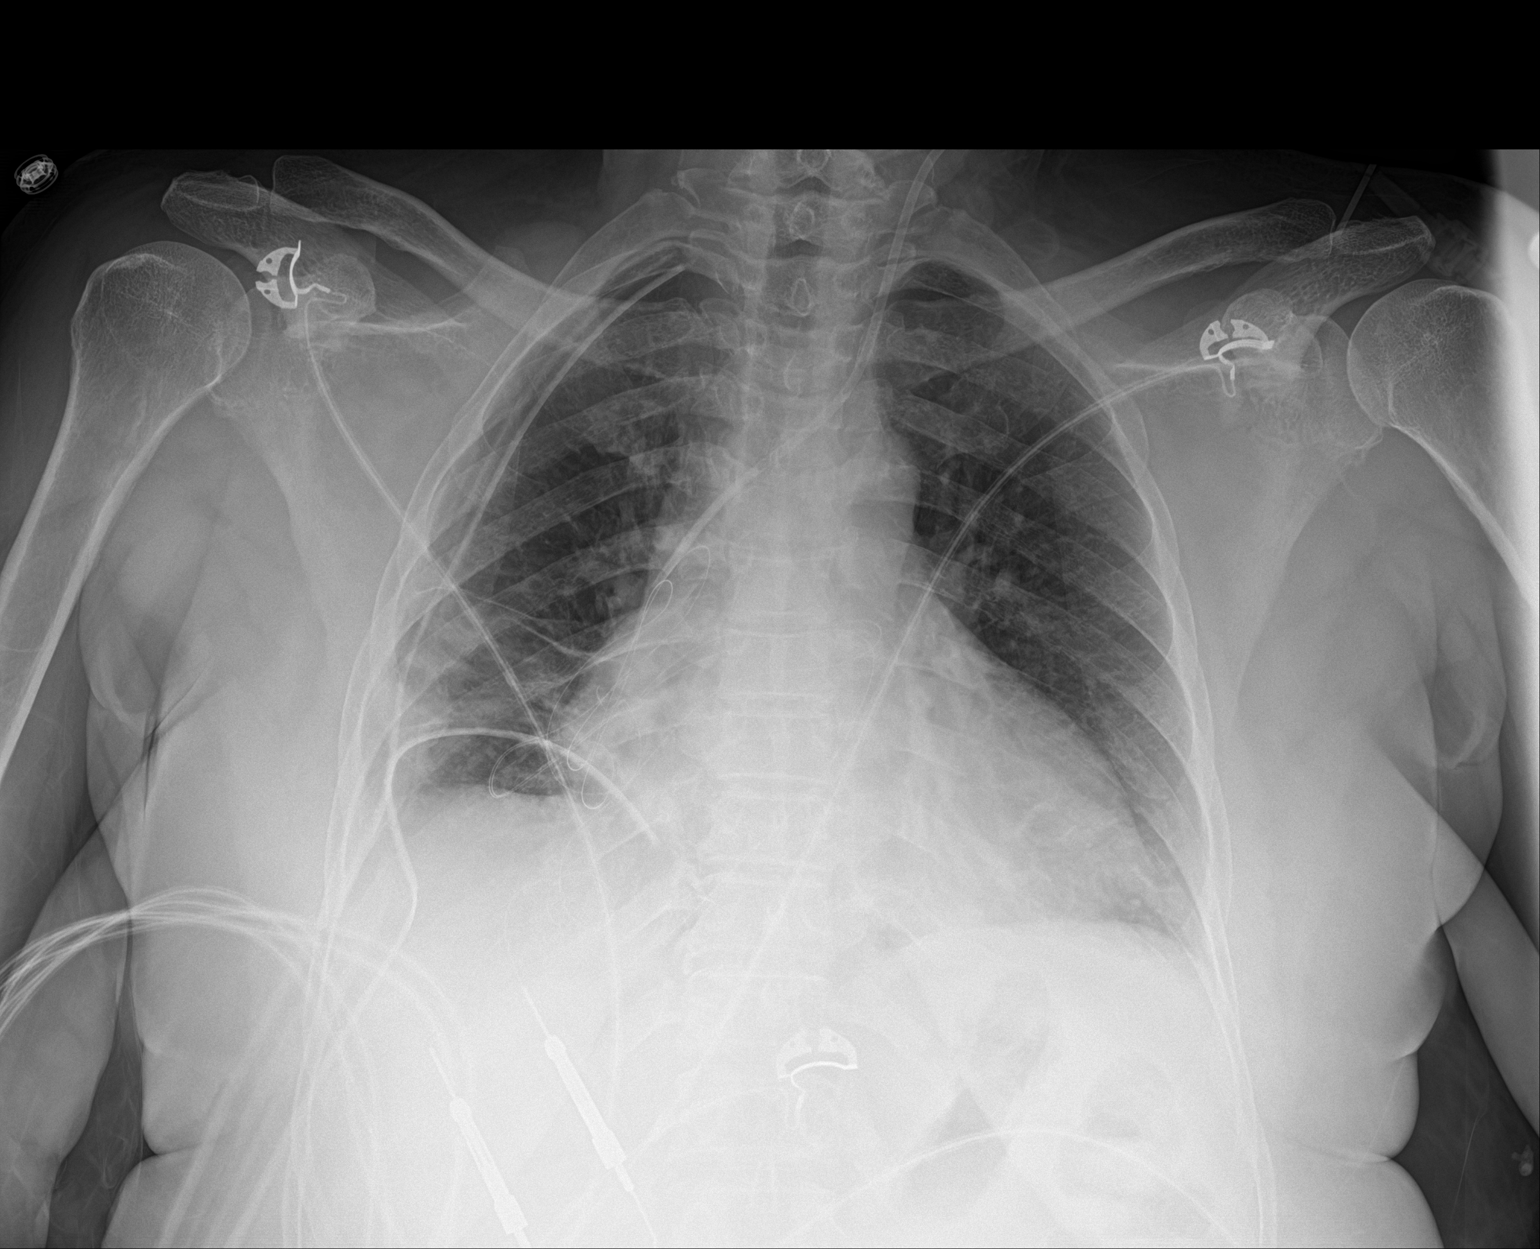

[1 of 1 positions shown; findings below may reference images not displayed]

FINDINGS: Heart is enlarged. Size is exaggerated by low lung volumes. Left IJ
line is stable. 2 right-sided chest tubes remain. Mild bibasilar
atelectasis is similar the prior study. There is no significant
pneumothorax. Small right pleural effusion remains.
IMPRESSION: 1. No acute cardiopulmonary disease or significant interval change.
2. Right-sided chest tubes without pneumothorax.
3. Left IJ line is stable.
4. Mild bibasilar atelectasis.

## 2020-08-07 DIAGNOSIS — E1129 Type 2 diabetes mellitus with other diabetic kidney complication: Secondary | ICD-10-CM | POA: Diagnosis not present

## 2020-08-07 DIAGNOSIS — E7800 Pure hypercholesterolemia, unspecified: Secondary | ICD-10-CM | POA: Diagnosis not present

## 2020-08-07 DIAGNOSIS — Z0000 Encounter for general adult medical examination without abnormal findings: Secondary | ICD-10-CM | POA: Diagnosis not present

## 2020-08-14 DIAGNOSIS — K219 Gastro-esophageal reflux disease without esophagitis: Secondary | ICD-10-CM | POA: Diagnosis not present

## 2020-08-14 DIAGNOSIS — R809 Proteinuria, unspecified: Secondary | ICD-10-CM | POA: Diagnosis not present

## 2020-08-14 DIAGNOSIS — R82998 Other abnormal findings in urine: Secondary | ICD-10-CM | POA: Diagnosis not present

## 2020-08-14 DIAGNOSIS — I129 Hypertensive chronic kidney disease with stage 1 through stage 4 chronic kidney disease, or unspecified chronic kidney disease: Secondary | ICD-10-CM | POA: Diagnosis not present

## 2020-08-14 DIAGNOSIS — N182 Chronic kidney disease, stage 2 (mild): Secondary | ICD-10-CM | POA: Diagnosis not present

## 2020-08-14 DIAGNOSIS — E7800 Pure hypercholesterolemia, unspecified: Secondary | ICD-10-CM | POA: Diagnosis not present

## 2020-08-14 DIAGNOSIS — I272 Pulmonary hypertension, unspecified: Secondary | ICD-10-CM | POA: Diagnosis not present

## 2020-08-14 DIAGNOSIS — E1129 Type 2 diabetes mellitus with other diabetic kidney complication: Secondary | ICD-10-CM | POA: Diagnosis not present

## 2020-08-14 DIAGNOSIS — J309 Allergic rhinitis, unspecified: Secondary | ICD-10-CM | POA: Diagnosis not present

## 2020-08-14 DIAGNOSIS — Z0000 Encounter for general adult medical examination without abnormal findings: Secondary | ICD-10-CM | POA: Diagnosis not present

## 2020-08-14 DIAGNOSIS — I10 Essential (primary) hypertension: Secondary | ICD-10-CM | POA: Diagnosis not present

## 2020-08-18 MED FILL — LOSARTAN POTASSIUM 25 MG TA: 25 | 30 days supply | Qty: 30 | Fill #3

## 2020-08-18 MED FILL — JANUMET XR 100-1,000 MG TAB: 100-1000 | 30 days supply | Qty: 30 | Fill #6

## 2020-09-09 ENCOUNTER — Other Ambulatory Visit (HOSPITAL_COMMUNITY): Payer: Self-pay | Admitting: Internal Medicine

## 2020-09-09 MED FILL — PANTOPRAZOLE SOD DR 40 MG T: 40 | 90 days supply | Qty: 180 | Fill #1

## 2020-09-09 MED FILL — ATORVASTATIN 80 MG TABLET: 80 | 90 days supply | Qty: 90 | Fill #0

## 2020-09-11 MED FILL — LOSARTAN POTASSIUM 25 MG TA: 25 | 30 days supply | Qty: 30 | Fill #4

## 2020-09-19 MED FILL — JANUMET XR 100-1,000 MG TAB: 100-1000 | 30 days supply | Qty: 30 | Fill #7

## 2020-10-20 ENCOUNTER — Other Ambulatory Visit (HOSPITAL_COMMUNITY): Payer: Self-pay

## 2020-10-20 MED ORDER — AMOXICILLIN 500 MG PO CAPS
500.0000 mg | ORAL_CAPSULE | Freq: Three times a day (TID) | ORAL | 0 refills | Status: AC
  Filled 2020-10-20: qty 21, 7d supply, fill #0

## 2020-10-23 ENCOUNTER — Other Ambulatory Visit (HOSPITAL_COMMUNITY): Payer: Self-pay | Admitting: Internal Medicine

## 2020-10-23 ENCOUNTER — Other Ambulatory Visit (HOSPITAL_COMMUNITY): Payer: Self-pay

## 2020-10-23 MED FILL — Sitagliptin-Metformin HCl Tab ER 24HR 100-1000 MG: ORAL | 30 days supply | Qty: 30 | Fill #0 | Status: AC

## 2020-10-23 MED FILL — Sitagliptin-Metformin HCl Tab ER 24HR 100-1000 MG: ORAL | 30 days supply | Qty: 30 | Fill #0 | Status: CN

## 2020-10-24 ENCOUNTER — Other Ambulatory Visit (HOSPITAL_COMMUNITY): Payer: Self-pay

## 2020-10-27 ENCOUNTER — Other Ambulatory Visit (HOSPITAL_COMMUNITY): Payer: Self-pay

## 2020-10-28 ENCOUNTER — Other Ambulatory Visit (HOSPITAL_COMMUNITY): Payer: Self-pay

## 2020-11-03 ENCOUNTER — Other Ambulatory Visit (HOSPITAL_COMMUNITY): Payer: Self-pay

## 2020-11-03 MED ORDER — LOSARTAN POTASSIUM 25 MG PO TABS
25.0000 mg | ORAL_TABLET | Freq: Every day | ORAL | 2 refills | Status: DC
  Filled 2020-11-03: qty 90, 90d supply, fill #0
  Filled 2021-02-13: qty 90, 90d supply, fill #1
  Filled 2021-05-10: qty 90, 90d supply, fill #2

## 2020-11-05 ENCOUNTER — Other Ambulatory Visit (HOSPITAL_COMMUNITY): Payer: Self-pay

## 2020-11-20 ENCOUNTER — Other Ambulatory Visit (HOSPITAL_COMMUNITY): Payer: Self-pay

## 2020-11-20 DIAGNOSIS — K219 Gastro-esophageal reflux disease without esophagitis: Secondary | ICD-10-CM | POA: Diagnosis not present

## 2020-11-20 DIAGNOSIS — R059 Cough, unspecified: Secondary | ICD-10-CM | POA: Diagnosis not present

## 2020-11-20 DIAGNOSIS — Z1152 Encounter for screening for COVID-19: Secondary | ICD-10-CM | POA: Diagnosis not present

## 2020-11-20 DIAGNOSIS — B9789 Other viral agents as the cause of diseases classified elsewhere: Secondary | ICD-10-CM | POA: Diagnosis not present

## 2020-11-20 DIAGNOSIS — I272 Pulmonary hypertension, unspecified: Secondary | ICD-10-CM | POA: Diagnosis not present

## 2020-11-20 DIAGNOSIS — J988 Other specified respiratory disorders: Secondary | ICD-10-CM | POA: Diagnosis not present

## 2020-11-20 MED ORDER — FAMOTIDINE 20 MG PO TABS
20.0000 mg | ORAL_TABLET | Freq: Every day | ORAL | 3 refills | Status: DC
  Filled 2020-11-20: qty 90, 90d supply, fill #0
  Filled 2021-02-13: qty 90, 90d supply, fill #1
  Filled 2021-08-23: qty 90, 90d supply, fill #2

## 2020-11-23 MED FILL — Hydrochlorothiazide Tab 25 MG: ORAL | 90 days supply | Qty: 90 | Fill #0 | Status: AC

## 2020-11-23 MED FILL — Sitagliptin-Metformin HCl Tab ER 24HR 100-1000 MG: ORAL | 30 days supply | Qty: 30 | Fill #1 | Status: AC

## 2020-11-24 ENCOUNTER — Other Ambulatory Visit (HOSPITAL_COMMUNITY): Payer: Self-pay

## 2020-11-28 DIAGNOSIS — Z1212 Encounter for screening for malignant neoplasm of rectum: Secondary | ICD-10-CM | POA: Diagnosis not present

## 2020-12-14 MED FILL — Atorvastatin Calcium Tab 80 MG (Base Equivalent): ORAL | 90 days supply | Qty: 90 | Fill #0 | Status: AC

## 2020-12-15 ENCOUNTER — Other Ambulatory Visit (HOSPITAL_COMMUNITY): Payer: Self-pay

## 2020-12-16 ENCOUNTER — Other Ambulatory Visit (HOSPITAL_COMMUNITY): Payer: Self-pay

## 2020-12-28 MED FILL — Sitagliptin-Metformin HCl Tab ER 24HR 100-1000 MG: ORAL | 30 days supply | Qty: 30 | Fill #2 | Status: AC

## 2020-12-29 ENCOUNTER — Other Ambulatory Visit (HOSPITAL_COMMUNITY): Payer: Self-pay

## 2021-01-30 ENCOUNTER — Other Ambulatory Visit (HOSPITAL_COMMUNITY): Payer: Self-pay

## 2021-01-30 MED FILL — Sitagliptin-Metformin HCl Tab ER 24HR 100-1000 MG: ORAL | 30 days supply | Qty: 30 | Fill #3 | Status: AC

## 2021-02-13 ENCOUNTER — Other Ambulatory Visit (HOSPITAL_COMMUNITY): Payer: Self-pay

## 2021-02-13 MED FILL — Hydrochlorothiazide Tab 25 MG: ORAL | 90 days supply | Qty: 90 | Fill #1 | Status: AC

## 2021-03-02 ENCOUNTER — Other Ambulatory Visit (HOSPITAL_COMMUNITY): Payer: Self-pay

## 2021-03-03 ENCOUNTER — Other Ambulatory Visit (HOSPITAL_COMMUNITY): Payer: Self-pay

## 2021-03-03 MED ORDER — JANUMET XR 100-1000 MG PO TB24
1.0000 | ORAL_TABLET | Freq: Every day | ORAL | 12 refills | Status: DC
  Filled 2021-03-03: qty 30, 30d supply, fill #0
  Filled 2021-04-05: qty 30, 30d supply, fill #1
  Filled 2021-05-10: qty 30, 30d supply, fill #2
  Filled 2021-06-07: qty 30, 30d supply, fill #3
  Filled 2021-07-12: qty 30, 30d supply, fill #4
  Filled 2021-08-23: qty 30, 30d supply, fill #5
  Filled 2021-09-20: qty 30, 30d supply, fill #6
  Filled 2021-10-25: qty 30, 30d supply, fill #7
  Filled 2021-12-02: qty 30, 30d supply, fill #8
  Filled 2022-01-08: qty 30, 30d supply, fill #9
  Filled 2022-02-07: qty 30, 30d supply, fill #10

## 2021-03-12 MED FILL — Atorvastatin Calcium Tab 80 MG (Base Equivalent): ORAL | 90 days supply | Qty: 90 | Fill #1 | Status: AC

## 2021-03-13 ENCOUNTER — Other Ambulatory Visit (HOSPITAL_COMMUNITY): Payer: Self-pay

## 2021-03-19 ENCOUNTER — Other Ambulatory Visit (HOSPITAL_COMMUNITY): Payer: Self-pay

## 2021-03-19 DIAGNOSIS — K219 Gastro-esophageal reflux disease without esophagitis: Secondary | ICD-10-CM | POA: Diagnosis not present

## 2021-03-19 DIAGNOSIS — E1129 Type 2 diabetes mellitus with other diabetic kidney complication: Secondary | ICD-10-CM | POA: Diagnosis not present

## 2021-03-19 DIAGNOSIS — J309 Allergic rhinitis, unspecified: Secondary | ICD-10-CM | POA: Diagnosis not present

## 2021-03-19 DIAGNOSIS — I272 Pulmonary hypertension, unspecified: Secondary | ICD-10-CM | POA: Diagnosis not present

## 2021-03-19 DIAGNOSIS — I129 Hypertensive chronic kidney disease with stage 1 through stage 4 chronic kidney disease, or unspecified chronic kidney disease: Secondary | ICD-10-CM | POA: Diagnosis not present

## 2021-03-19 DIAGNOSIS — R809 Proteinuria, unspecified: Secondary | ICD-10-CM | POA: Diagnosis not present

## 2021-03-19 DIAGNOSIS — N182 Chronic kidney disease, stage 2 (mild): Secondary | ICD-10-CM | POA: Diagnosis not present

## 2021-03-19 DIAGNOSIS — E7800 Pure hypercholesterolemia, unspecified: Secondary | ICD-10-CM | POA: Diagnosis not present

## 2021-03-19 DIAGNOSIS — E669 Obesity, unspecified: Secondary | ICD-10-CM | POA: Diagnosis not present

## 2021-03-19 MED ORDER — FREESTYLE LITE W/DEVICE KIT
PACK | 0 refills | Status: AC
  Filled 2021-03-19: qty 1, 30d supply, fill #0

## 2021-03-19 MED ORDER — FREESTYLE LANCETS MISC
0 refills | Status: AC
  Filled 2021-03-19: qty 100, 90d supply, fill #0

## 2021-03-19 MED ORDER — FREESTYLE LITE TEST VI STRP
ORAL_STRIP | 3 refills | Status: AC
  Filled 2021-03-19: qty 50, 50d supply, fill #0

## 2021-04-06 ENCOUNTER — Other Ambulatory Visit (HOSPITAL_COMMUNITY): Payer: Self-pay

## 2021-05-11 ENCOUNTER — Other Ambulatory Visit (HOSPITAL_COMMUNITY): Payer: Self-pay

## 2021-05-12 ENCOUNTER — Other Ambulatory Visit (HOSPITAL_COMMUNITY): Payer: Self-pay

## 2021-06-07 MED FILL — Hydrochlorothiazide Tab 25 MG: ORAL | 90 days supply | Qty: 90 | Fill #2 | Status: AC

## 2021-06-08 ENCOUNTER — Other Ambulatory Visit (HOSPITAL_COMMUNITY): Payer: Self-pay

## 2021-07-13 ENCOUNTER — Other Ambulatory Visit (HOSPITAL_COMMUNITY): Payer: Self-pay

## 2021-08-23 MED FILL — Atorvastatin Calcium Tab 80 MG (Base Equivalent): ORAL | 90 days supply | Qty: 90 | Fill #2 | Status: AC

## 2021-08-24 ENCOUNTER — Other Ambulatory Visit (HOSPITAL_COMMUNITY): Payer: Self-pay

## 2021-08-27 ENCOUNTER — Other Ambulatory Visit (HOSPITAL_COMMUNITY): Payer: Self-pay

## 2021-08-27 DIAGNOSIS — K219 Gastro-esophageal reflux disease without esophagitis: Secondary | ICD-10-CM | POA: Diagnosis not present

## 2021-08-27 DIAGNOSIS — N182 Chronic kidney disease, stage 2 (mild): Secondary | ICD-10-CM | POA: Diagnosis not present

## 2021-08-27 DIAGNOSIS — Z1339 Encounter for screening examination for other mental health and behavioral disorders: Secondary | ICD-10-CM | POA: Diagnosis not present

## 2021-08-27 DIAGNOSIS — E669 Obesity, unspecified: Secondary | ICD-10-CM | POA: Diagnosis not present

## 2021-08-27 DIAGNOSIS — Z1331 Encounter for screening for depression: Secondary | ICD-10-CM | POA: Diagnosis not present

## 2021-08-27 DIAGNOSIS — R82998 Other abnormal findings in urine: Secondary | ICD-10-CM | POA: Diagnosis not present

## 2021-08-27 DIAGNOSIS — Z0000 Encounter for general adult medical examination without abnormal findings: Secondary | ICD-10-CM | POA: Diagnosis not present

## 2021-08-27 DIAGNOSIS — I272 Pulmonary hypertension, unspecified: Secondary | ICD-10-CM | POA: Diagnosis not present

## 2021-08-27 DIAGNOSIS — E1129 Type 2 diabetes mellitus with other diabetic kidney complication: Secondary | ICD-10-CM | POA: Diagnosis not present

## 2021-08-27 DIAGNOSIS — E7800 Pure hypercholesterolemia, unspecified: Secondary | ICD-10-CM | POA: Diagnosis not present

## 2021-08-27 DIAGNOSIS — I129 Hypertensive chronic kidney disease with stage 1 through stage 4 chronic kidney disease, or unspecified chronic kidney disease: Secondary | ICD-10-CM | POA: Diagnosis not present

## 2021-08-27 MED ORDER — PANTOPRAZOLE SODIUM 40 MG PO TBEC
40.0000 mg | DELAYED_RELEASE_TABLET | Freq: Two times a day (BID) | ORAL | 3 refills | Status: DC
  Filled 2021-08-27: qty 180, 90d supply, fill #0
  Filled 2022-01-08: qty 180, 90d supply, fill #1
  Filled 2022-04-16: qty 180, 90d supply, fill #2
  Filled 2022-08-17: qty 180, 90d supply, fill #3

## 2021-08-30 ENCOUNTER — Other Ambulatory Visit (HOSPITAL_COMMUNITY): Payer: Self-pay

## 2021-08-31 ENCOUNTER — Other Ambulatory Visit (HOSPITAL_COMMUNITY): Payer: Self-pay

## 2021-08-31 MED ORDER — LOSARTAN POTASSIUM 25 MG PO TABS
25.0000 mg | ORAL_TABLET | Freq: Every day | ORAL | 3 refills | Status: DC
  Filled 2021-08-31: qty 90, 90d supply, fill #0
  Filled 2021-12-02: qty 90, 90d supply, fill #1
  Filled 2022-03-04: qty 90, 90d supply, fill #2
  Filled 2022-05-28: qty 90, 90d supply, fill #3

## 2021-09-03 ENCOUNTER — Other Ambulatory Visit (HOSPITAL_COMMUNITY): Payer: Self-pay

## 2021-09-04 ENCOUNTER — Other Ambulatory Visit (HOSPITAL_COMMUNITY): Payer: Self-pay

## 2021-09-07 ENCOUNTER — Other Ambulatory Visit (HOSPITAL_COMMUNITY): Payer: Self-pay

## 2021-09-07 MED ORDER — HYDROCHLOROTHIAZIDE 25 MG PO TABS
25.0000 mg | ORAL_TABLET | Freq: Every day | ORAL | 2 refills | Status: DC
  Filled 2021-09-07: qty 90, 90d supply, fill #0
  Filled 2021-12-02: qty 90, 90d supply, fill #1
  Filled 2022-03-04: qty 90, 90d supply, fill #2

## 2021-09-08 ENCOUNTER — Other Ambulatory Visit (HOSPITAL_COMMUNITY): Payer: Self-pay

## 2021-09-16 ENCOUNTER — Other Ambulatory Visit (HOSPITAL_COMMUNITY): Payer: Self-pay

## 2021-09-21 ENCOUNTER — Other Ambulatory Visit (HOSPITAL_COMMUNITY): Payer: Self-pay

## 2021-09-21 MED ORDER — PREDNISONE 10 MG PO TABS
10.0000 mg | ORAL_TABLET | Freq: Three times a day (TID) | ORAL | 2 refills | Status: DC | PRN
  Filled 2021-09-21: qty 30, 5d supply, fill #0
  Filled 2022-02-07: qty 30, 5d supply, fill #1
  Filled 2022-06-21 – 2022-06-23 (×3): qty 30, 5d supply, fill #2

## 2021-10-26 ENCOUNTER — Other Ambulatory Visit (HOSPITAL_COMMUNITY): Payer: Self-pay

## 2021-10-29 DIAGNOSIS — H52223 Regular astigmatism, bilateral: Secondary | ICD-10-CM | POA: Diagnosis not present

## 2021-12-02 ENCOUNTER — Other Ambulatory Visit (HOSPITAL_COMMUNITY): Payer: Self-pay

## 2021-12-03 ENCOUNTER — Other Ambulatory Visit (HOSPITAL_COMMUNITY): Payer: Self-pay

## 2021-12-07 ENCOUNTER — Other Ambulatory Visit (HOSPITAL_COMMUNITY): Payer: Self-pay

## 2021-12-11 ENCOUNTER — Other Ambulatory Visit (HOSPITAL_COMMUNITY): Payer: Self-pay

## 2021-12-15 ENCOUNTER — Other Ambulatory Visit (HOSPITAL_COMMUNITY): Payer: Self-pay

## 2021-12-18 ENCOUNTER — Other Ambulatory Visit (HOSPITAL_COMMUNITY): Payer: Self-pay

## 2021-12-18 MED ORDER — ATORVASTATIN CALCIUM 80 MG PO TABS
80.0000 mg | ORAL_TABLET | Freq: Every day | ORAL | 3 refills | Status: DC
  Filled 2021-12-18: qty 90, 90d supply, fill #0
  Filled 2022-03-21: qty 90, 90d supply, fill #1
  Filled 2022-06-21 – 2022-06-23 (×3): qty 90, 90d supply, fill #2
  Filled 2022-09-21: qty 90, 90d supply, fill #3

## 2022-01-08 ENCOUNTER — Other Ambulatory Visit (HOSPITAL_COMMUNITY): Payer: Self-pay

## 2022-02-08 ENCOUNTER — Other Ambulatory Visit (HOSPITAL_COMMUNITY): Payer: Self-pay

## 2022-03-04 ENCOUNTER — Other Ambulatory Visit (HOSPITAL_COMMUNITY): Payer: Self-pay

## 2022-03-04 DIAGNOSIS — E7800 Pure hypercholesterolemia, unspecified: Secondary | ICD-10-CM | POA: Diagnosis not present

## 2022-03-04 DIAGNOSIS — K219 Gastro-esophageal reflux disease without esophagitis: Secondary | ICD-10-CM | POA: Diagnosis not present

## 2022-03-04 DIAGNOSIS — J309 Allergic rhinitis, unspecified: Secondary | ICD-10-CM | POA: Diagnosis not present

## 2022-03-04 DIAGNOSIS — E669 Obesity, unspecified: Secondary | ICD-10-CM | POA: Diagnosis not present

## 2022-03-04 DIAGNOSIS — E1129 Type 2 diabetes mellitus with other diabetic kidney complication: Secondary | ICD-10-CM | POA: Diagnosis not present

## 2022-03-04 DIAGNOSIS — I272 Pulmonary hypertension, unspecified: Secondary | ICD-10-CM | POA: Diagnosis not present

## 2022-03-04 DIAGNOSIS — R809 Proteinuria, unspecified: Secondary | ICD-10-CM | POA: Diagnosis not present

## 2022-03-04 DIAGNOSIS — N182 Chronic kidney disease, stage 2 (mild): Secondary | ICD-10-CM | POA: Diagnosis not present

## 2022-03-04 DIAGNOSIS — I129 Hypertensive chronic kidney disease with stage 1 through stage 4 chronic kidney disease, or unspecified chronic kidney disease: Secondary | ICD-10-CM | POA: Diagnosis not present

## 2022-03-04 MED ORDER — JANUMET XR 100-1000 MG PO TB24
1.0000 | ORAL_TABLET | Freq: Every day | ORAL | 13 refills | Status: DC
  Filled 2022-03-04: qty 30, 30d supply, fill #0
  Filled 2022-04-16: qty 30, 30d supply, fill #1
  Filled 2022-05-11: qty 30, 30d supply, fill #2
  Filled 2022-06-21 – 2022-06-23 (×3): qty 30, 30d supply, fill #3
  Filled 2022-07-18: qty 30, 30d supply, fill #4
  Filled 2022-08-17: qty 30, 30d supply, fill #5
  Filled 2022-09-21: qty 30, 30d supply, fill #6
  Filled 2022-11-01: qty 30, 30d supply, fill #7
  Filled 2022-12-05 – 2022-12-07 (×2): qty 30, 30d supply, fill #8
  Filled 2023-01-11: qty 30, 30d supply, fill #9
  Filled 2023-02-06: qty 30, 30d supply, fill #10

## 2022-03-04 MED ORDER — TOBRAMYCIN 0.3 % OP SOLN
1.0000 [drp] | OPHTHALMIC | 0 refills | Status: AC
  Filled 2022-03-04: qty 5, 8d supply, fill #0

## 2022-03-22 ENCOUNTER — Other Ambulatory Visit (HOSPITAL_COMMUNITY): Payer: Self-pay

## 2022-04-07 ENCOUNTER — Other Ambulatory Visit (HOSPITAL_COMMUNITY): Payer: Self-pay

## 2022-04-07 MED ORDER — AMOXICILLIN-POT CLAVULANATE 875-125 MG PO TABS
1.0000 | ORAL_TABLET | Freq: Two times a day (BID) | ORAL | 0 refills | Status: AC
  Filled 2022-04-07: qty 14, 7d supply, fill #0

## 2022-04-16 ENCOUNTER — Other Ambulatory Visit (HOSPITAL_COMMUNITY): Payer: Self-pay

## 2022-04-16 MED ORDER — FAMOTIDINE 20 MG PO TABS
20.0000 mg | ORAL_TABLET | Freq: Every day | ORAL | 3 refills | Status: AC
  Filled 2022-04-16 – 2022-05-28 (×2): qty 90, 90d supply, fill #0

## 2022-04-26 ENCOUNTER — Other Ambulatory Visit (HOSPITAL_COMMUNITY): Payer: Self-pay

## 2022-05-11 ENCOUNTER — Other Ambulatory Visit (HOSPITAL_COMMUNITY): Payer: Self-pay

## 2022-05-28 ENCOUNTER — Other Ambulatory Visit (HOSPITAL_COMMUNITY): Payer: Self-pay

## 2022-05-28 ENCOUNTER — Other Ambulatory Visit: Payer: Self-pay

## 2022-05-28 MED ORDER — HYDROCHLOROTHIAZIDE 25 MG PO TABS
25.0000 mg | ORAL_TABLET | Freq: Every day | ORAL | 2 refills | Status: DC
  Filled 2022-05-28: qty 90, 90d supply, fill #0
  Filled 2022-08-17: qty 90, 90d supply, fill #1
  Filled 2022-12-05: qty 90, 90d supply, fill #2

## 2022-06-22 ENCOUNTER — Other Ambulatory Visit: Payer: Self-pay

## 2022-06-22 ENCOUNTER — Other Ambulatory Visit (HOSPITAL_COMMUNITY): Payer: Self-pay

## 2022-06-23 ENCOUNTER — Other Ambulatory Visit (HOSPITAL_COMMUNITY): Payer: Self-pay

## 2022-08-17 ENCOUNTER — Other Ambulatory Visit (HOSPITAL_COMMUNITY): Payer: Self-pay

## 2022-08-18 ENCOUNTER — Other Ambulatory Visit: Payer: Self-pay

## 2022-08-18 ENCOUNTER — Other Ambulatory Visit (HOSPITAL_COMMUNITY): Payer: Self-pay

## 2022-08-18 MED ORDER — LOSARTAN POTASSIUM 25 MG PO TABS
25.0000 mg | ORAL_TABLET | Freq: Every day | ORAL | 3 refills | Status: DC
  Filled 2022-08-18: qty 90, 90d supply, fill #0
  Filled 2022-12-05: qty 90, 90d supply, fill #1
  Filled 2023-04-10: qty 90, 90d supply, fill #2
  Filled 2023-07-09: qty 90, 90d supply, fill #3

## 2022-08-30 DIAGNOSIS — E7800 Pure hypercholesterolemia, unspecified: Secondary | ICD-10-CM | POA: Diagnosis not present

## 2022-08-30 DIAGNOSIS — I129 Hypertensive chronic kidney disease with stage 1 through stage 4 chronic kidney disease, or unspecified chronic kidney disease: Secondary | ICD-10-CM | POA: Diagnosis not present

## 2022-08-30 DIAGNOSIS — E1129 Type 2 diabetes mellitus with other diabetic kidney complication: Secondary | ICD-10-CM | POA: Diagnosis not present

## 2022-08-30 DIAGNOSIS — K219 Gastro-esophageal reflux disease without esophagitis: Secondary | ICD-10-CM | POA: Diagnosis not present

## 2022-09-09 DIAGNOSIS — R82998 Other abnormal findings in urine: Secondary | ICD-10-CM | POA: Diagnosis not present

## 2022-09-09 DIAGNOSIS — E1129 Type 2 diabetes mellitus with other diabetic kidney complication: Secondary | ICD-10-CM | POA: Diagnosis not present

## 2022-09-21 ENCOUNTER — Other Ambulatory Visit: Payer: Self-pay

## 2022-12-05 ENCOUNTER — Other Ambulatory Visit (HOSPITAL_COMMUNITY): Payer: Self-pay

## 2022-12-06 ENCOUNTER — Other Ambulatory Visit (HOSPITAL_COMMUNITY): Payer: Self-pay

## 2022-12-06 MED ORDER — PANTOPRAZOLE SODIUM 40 MG PO TBEC
DELAYED_RELEASE_TABLET | ORAL | 3 refills | Status: DC
  Filled 2022-12-06 – 2022-12-07 (×2): qty 180, 90d supply, fill #0
  Filled 2023-05-11: qty 180, 90d supply, fill #1
  Filled 2023-07-23 – 2023-08-26 (×2): qty 180, 90d supply, fill #2

## 2022-12-07 ENCOUNTER — Other Ambulatory Visit (HOSPITAL_COMMUNITY): Payer: Self-pay

## 2022-12-07 ENCOUNTER — Other Ambulatory Visit: Payer: Self-pay

## 2022-12-07 ENCOUNTER — Encounter: Payer: Self-pay | Admitting: Pharmacist

## 2022-12-08 ENCOUNTER — Other Ambulatory Visit (HOSPITAL_COMMUNITY): Payer: Self-pay

## 2022-12-08 ENCOUNTER — Other Ambulatory Visit: Payer: Self-pay

## 2022-12-19 ENCOUNTER — Other Ambulatory Visit (HOSPITAL_COMMUNITY): Payer: Self-pay

## 2022-12-20 ENCOUNTER — Other Ambulatory Visit (HOSPITAL_COMMUNITY): Payer: Self-pay

## 2022-12-20 MED ORDER — ATORVASTATIN CALCIUM 80 MG PO TABS
80.0000 mg | ORAL_TABLET | Freq: Every day | ORAL | 3 refills | Status: DC
  Filled 2022-12-20 – 2023-01-11 (×2): qty 90, 90d supply, fill #0
  Filled 2023-04-10: qty 90, 90d supply, fill #1
  Filled 2023-07-09: qty 90, 90d supply, fill #2
  Filled 2023-11-03: qty 90, 90d supply, fill #3

## 2022-12-28 ENCOUNTER — Other Ambulatory Visit (HOSPITAL_COMMUNITY): Payer: Self-pay

## 2023-01-12 ENCOUNTER — Other Ambulatory Visit (HOSPITAL_COMMUNITY): Payer: Self-pay

## 2023-02-03 ENCOUNTER — Other Ambulatory Visit (HOSPITAL_COMMUNITY): Payer: Self-pay

## 2023-03-13 ENCOUNTER — Other Ambulatory Visit (HOSPITAL_COMMUNITY): Payer: Self-pay

## 2023-03-14 ENCOUNTER — Other Ambulatory Visit (HOSPITAL_COMMUNITY): Payer: Self-pay

## 2023-03-14 MED ORDER — JANUMET XR 100-1000 MG PO TB24
1.0000 | ORAL_TABLET | Freq: Every day | ORAL | 0 refills | Status: DC
  Filled 2023-03-14: qty 30, 30d supply, fill #0

## 2023-03-17 ENCOUNTER — Other Ambulatory Visit (HOSPITAL_COMMUNITY): Payer: Self-pay

## 2023-03-17 ENCOUNTER — Other Ambulatory Visit: Payer: Self-pay

## 2023-03-28 DIAGNOSIS — J309 Allergic rhinitis, unspecified: Secondary | ICD-10-CM | POA: Diagnosis not present

## 2023-03-28 DIAGNOSIS — E7800 Pure hypercholesterolemia, unspecified: Secondary | ICD-10-CM | POA: Diagnosis not present

## 2023-03-28 DIAGNOSIS — I272 Pulmonary hypertension, unspecified: Secondary | ICD-10-CM | POA: Diagnosis not present

## 2023-03-28 DIAGNOSIS — K219 Gastro-esophageal reflux disease without esophagitis: Secondary | ICD-10-CM | POA: Diagnosis not present

## 2023-03-28 DIAGNOSIS — N182 Chronic kidney disease, stage 2 (mild): Secondary | ICD-10-CM | POA: Diagnosis not present

## 2023-03-28 DIAGNOSIS — R809 Proteinuria, unspecified: Secondary | ICD-10-CM | POA: Diagnosis not present

## 2023-03-28 DIAGNOSIS — E669 Obesity, unspecified: Secondary | ICD-10-CM | POA: Diagnosis not present

## 2023-03-28 DIAGNOSIS — I129 Hypertensive chronic kidney disease with stage 1 through stage 4 chronic kidney disease, or unspecified chronic kidney disease: Secondary | ICD-10-CM | POA: Diagnosis not present

## 2023-03-28 DIAGNOSIS — E1129 Type 2 diabetes mellitus with other diabetic kidney complication: Secondary | ICD-10-CM | POA: Diagnosis not present

## 2023-04-10 ENCOUNTER — Other Ambulatory Visit (HOSPITAL_COMMUNITY): Payer: Self-pay

## 2023-04-11 ENCOUNTER — Other Ambulatory Visit (HOSPITAL_COMMUNITY): Payer: Self-pay

## 2023-04-11 ENCOUNTER — Other Ambulatory Visit: Payer: Self-pay

## 2023-04-11 MED ORDER — JANUMET XR 100-1000 MG PO TB24
1.0000 | ORAL_TABLET | Freq: Every day | ORAL | 13 refills | Status: DC
  Filled 2023-04-11 – 2023-04-14 (×2): qty 30, 30d supply, fill #0
  Filled 2023-05-11: qty 30, 30d supply, fill #1
  Filled 2023-06-06: qty 30, 30d supply, fill #2
  Filled 2023-07-09 – 2023-07-27 (×3): qty 30, 30d supply, fill #3
  Filled 2023-08-26: qty 30, 30d supply, fill #4
  Filled 2023-09-25: qty 30, 30d supply, fill #5
  Filled 2023-11-03: qty 30, 30d supply, fill #6
  Filled 2023-12-08: qty 30, 30d supply, fill #7
  Filled 2024-01-05: qty 30, 30d supply, fill #8
  Filled 2024-02-04: qty 30, 30d supply, fill #9
  Filled 2024-03-06: qty 30, 30d supply, fill #10
  Filled 2024-04-04: qty 30, 30d supply, fill #11

## 2023-04-11 MED ORDER — HYDROCHLOROTHIAZIDE 25 MG PO TABS
25.0000 mg | ORAL_TABLET | Freq: Every day | ORAL | 2 refills | Status: DC
  Filled 2023-04-11 – 2023-04-14 (×2): qty 90, 90d supply, fill #0
  Filled 2023-07-09: qty 90, 90d supply, fill #1
  Filled 2023-11-03: qty 90, 90d supply, fill #2

## 2023-04-14 ENCOUNTER — Other Ambulatory Visit (HOSPITAL_COMMUNITY): Payer: Self-pay

## 2023-05-11 ENCOUNTER — Other Ambulatory Visit (HOSPITAL_COMMUNITY): Payer: Self-pay

## 2023-06-07 ENCOUNTER — Other Ambulatory Visit: Payer: Self-pay

## 2023-07-09 ENCOUNTER — Other Ambulatory Visit (HOSPITAL_COMMUNITY): Payer: Self-pay

## 2023-07-11 ENCOUNTER — Other Ambulatory Visit: Payer: Self-pay

## 2023-07-23 ENCOUNTER — Other Ambulatory Visit (HOSPITAL_COMMUNITY): Payer: Self-pay

## 2023-07-25 ENCOUNTER — Other Ambulatory Visit: Payer: Self-pay

## 2023-07-25 ENCOUNTER — Other Ambulatory Visit (HOSPITAL_COMMUNITY): Payer: Self-pay

## 2023-07-26 ENCOUNTER — Other Ambulatory Visit: Payer: Self-pay

## 2023-07-27 ENCOUNTER — Other Ambulatory Visit (HOSPITAL_COMMUNITY): Payer: Self-pay

## 2023-07-29 ENCOUNTER — Other Ambulatory Visit (HOSPITAL_COMMUNITY): Payer: Self-pay

## 2023-08-26 ENCOUNTER — Other Ambulatory Visit (HOSPITAL_COMMUNITY): Payer: Self-pay

## 2023-09-12 DIAGNOSIS — N182 Chronic kidney disease, stage 2 (mild): Secondary | ICD-10-CM | POA: Diagnosis not present

## 2023-09-12 DIAGNOSIS — K219 Gastro-esophageal reflux disease without esophagitis: Secondary | ICD-10-CM | POA: Diagnosis not present

## 2023-09-12 DIAGNOSIS — E7800 Pure hypercholesterolemia, unspecified: Secondary | ICD-10-CM | POA: Diagnosis not present

## 2023-09-12 DIAGNOSIS — I129 Hypertensive chronic kidney disease with stage 1 through stage 4 chronic kidney disease, or unspecified chronic kidney disease: Secondary | ICD-10-CM | POA: Diagnosis not present

## 2023-09-12 DIAGNOSIS — Z1212 Encounter for screening for malignant neoplasm of rectum: Secondary | ICD-10-CM | POA: Diagnosis not present

## 2023-09-12 DIAGNOSIS — E1129 Type 2 diabetes mellitus with other diabetic kidney complication: Secondary | ICD-10-CM | POA: Diagnosis not present

## 2023-09-19 DIAGNOSIS — E669 Obesity, unspecified: Secondary | ICD-10-CM | POA: Diagnosis not present

## 2023-09-19 DIAGNOSIS — N182 Chronic kidney disease, stage 2 (mild): Secondary | ICD-10-CM | POA: Diagnosis not present

## 2023-09-19 DIAGNOSIS — I272 Pulmonary hypertension, unspecified: Secondary | ICD-10-CM | POA: Diagnosis not present

## 2023-09-19 DIAGNOSIS — E1129 Type 2 diabetes mellitus with other diabetic kidney complication: Secondary | ICD-10-CM | POA: Diagnosis not present

## 2023-09-19 DIAGNOSIS — E7800 Pure hypercholesterolemia, unspecified: Secondary | ICD-10-CM | POA: Diagnosis not present

## 2023-09-19 DIAGNOSIS — R82998 Other abnormal findings in urine: Secondary | ICD-10-CM | POA: Diagnosis not present

## 2023-09-19 DIAGNOSIS — Z0000 Encounter for general adult medical examination without abnormal findings: Secondary | ICD-10-CM | POA: Diagnosis not present

## 2023-09-19 DIAGNOSIS — R809 Proteinuria, unspecified: Secondary | ICD-10-CM | POA: Diagnosis not present

## 2023-09-19 DIAGNOSIS — K219 Gastro-esophageal reflux disease without esophagitis: Secondary | ICD-10-CM | POA: Diagnosis not present

## 2023-09-19 DIAGNOSIS — I129 Hypertensive chronic kidney disease with stage 1 through stage 4 chronic kidney disease, or unspecified chronic kidney disease: Secondary | ICD-10-CM | POA: Diagnosis not present

## 2023-09-19 DIAGNOSIS — J309 Allergic rhinitis, unspecified: Secondary | ICD-10-CM | POA: Diagnosis not present

## 2023-09-21 ENCOUNTER — Other Ambulatory Visit (HOSPITAL_COMMUNITY): Payer: Self-pay

## 2023-09-21 MED ORDER — PREDNISONE 10 MG PO TABS
10.0000 mg | ORAL_TABLET | Freq: Three times a day (TID) | ORAL | 2 refills | Status: AC | PRN
  Filled 2023-09-21 – 2024-06-13 (×2): qty 30, 5d supply, fill #0

## 2023-09-25 ENCOUNTER — Other Ambulatory Visit (HOSPITAL_COMMUNITY): Payer: Self-pay

## 2023-09-27 ENCOUNTER — Other Ambulatory Visit (HOSPITAL_COMMUNITY): Payer: Self-pay

## 2023-11-03 ENCOUNTER — Other Ambulatory Visit (HOSPITAL_COMMUNITY): Payer: Self-pay

## 2023-11-03 MED ORDER — LOSARTAN POTASSIUM 25 MG PO TABS
25.0000 mg | ORAL_TABLET | Freq: Every day | ORAL | 3 refills | Status: AC
  Filled 2023-11-03: qty 90, 90d supply, fill #0
  Filled 2024-02-04: qty 90, 90d supply, fill #1
  Filled 2024-05-06: qty 90, 90d supply, fill #2

## 2023-12-08 ENCOUNTER — Other Ambulatory Visit (HOSPITAL_COMMUNITY): Payer: Self-pay

## 2023-12-09 ENCOUNTER — Other Ambulatory Visit (HOSPITAL_COMMUNITY): Payer: Self-pay

## 2023-12-09 ENCOUNTER — Other Ambulatory Visit: Payer: Self-pay

## 2023-12-09 MED ORDER — PANTOPRAZOLE SODIUM 40 MG PO TBEC
40.0000 mg | DELAYED_RELEASE_TABLET | Freq: Two times a day (BID) | ORAL | 3 refills | Status: AC
  Filled 2023-12-09: qty 180, 90d supply, fill #0
  Filled 2024-03-06: qty 180, 90d supply, fill #1
  Filled 2024-06-07: qty 180, 90d supply, fill #2

## 2024-01-06 ENCOUNTER — Other Ambulatory Visit (HOSPITAL_COMMUNITY): Payer: Self-pay

## 2024-02-04 ENCOUNTER — Other Ambulatory Visit (HOSPITAL_COMMUNITY): Payer: Self-pay

## 2024-02-06 ENCOUNTER — Other Ambulatory Visit: Payer: Self-pay

## 2024-02-06 ENCOUNTER — Other Ambulatory Visit (HOSPITAL_COMMUNITY): Payer: Self-pay

## 2024-02-06 MED ORDER — ATORVASTATIN CALCIUM 80 MG PO TABS
80.0000 mg | ORAL_TABLET | Freq: Every day | ORAL | 3 refills | Status: AC
  Filled 2024-02-06: qty 90, 90d supply, fill #0
  Filled 2024-05-06: qty 90, 90d supply, fill #1

## 2024-02-06 MED ORDER — HYDROCHLOROTHIAZIDE 25 MG PO TABS
25.0000 mg | ORAL_TABLET | Freq: Every day | ORAL | 2 refills | Status: AC
  Filled 2024-02-06: qty 90, 90d supply, fill #0
  Filled 2024-05-06: qty 90, 90d supply, fill #1

## 2024-02-07 ENCOUNTER — Other Ambulatory Visit: Payer: Self-pay

## 2024-03-06 ENCOUNTER — Other Ambulatory Visit (HOSPITAL_COMMUNITY): Payer: Self-pay

## 2024-04-04 ENCOUNTER — Other Ambulatory Visit (HOSPITAL_COMMUNITY): Payer: Self-pay

## 2024-04-09 DIAGNOSIS — K219 Gastro-esophageal reflux disease without esophagitis: Secondary | ICD-10-CM | POA: Diagnosis not present

## 2024-04-09 DIAGNOSIS — E669 Obesity, unspecified: Secondary | ICD-10-CM | POA: Diagnosis not present

## 2024-04-09 DIAGNOSIS — J309 Allergic rhinitis, unspecified: Secondary | ICD-10-CM | POA: Diagnosis not present

## 2024-04-09 DIAGNOSIS — N182 Chronic kidney disease, stage 2 (mild): Secondary | ICD-10-CM | POA: Diagnosis not present

## 2024-04-09 DIAGNOSIS — R809 Proteinuria, unspecified: Secondary | ICD-10-CM | POA: Diagnosis not present

## 2024-04-09 DIAGNOSIS — I129 Hypertensive chronic kidney disease with stage 1 through stage 4 chronic kidney disease, or unspecified chronic kidney disease: Secondary | ICD-10-CM | POA: Diagnosis not present

## 2024-04-09 DIAGNOSIS — I272 Pulmonary hypertension, unspecified: Secondary | ICD-10-CM | POA: Diagnosis not present

## 2024-04-09 DIAGNOSIS — E1122 Type 2 diabetes mellitus with diabetic chronic kidney disease: Secondary | ICD-10-CM | POA: Diagnosis not present

## 2024-04-09 DIAGNOSIS — E7800 Pure hypercholesterolemia, unspecified: Secondary | ICD-10-CM | POA: Diagnosis not present

## 2024-05-06 ENCOUNTER — Other Ambulatory Visit (HOSPITAL_COMMUNITY): Payer: Self-pay

## 2024-05-07 ENCOUNTER — Other Ambulatory Visit (HOSPITAL_COMMUNITY): Payer: Self-pay

## 2024-05-07 ENCOUNTER — Other Ambulatory Visit: Payer: Self-pay

## 2024-05-07 MED ORDER — JANUMET XR 100-1000 MG PO TB24
1.0000 | ORAL_TABLET | Freq: Every day | ORAL | 13 refills | Status: AC
  Filled 2024-05-07: qty 30, 30d supply, fill #0
  Filled 2024-06-07: qty 30, 30d supply, fill #1
  Filled 2024-07-17: qty 90, 90d supply, fill #2

## 2024-06-07 ENCOUNTER — Other Ambulatory Visit (HOSPITAL_COMMUNITY): Payer: Self-pay

## 2024-06-13 ENCOUNTER — Other Ambulatory Visit (HOSPITAL_COMMUNITY): Payer: Self-pay

## 2024-07-18 ENCOUNTER — Other Ambulatory Visit (HOSPITAL_COMMUNITY): Payer: Self-pay
